# Patient Record
Sex: Male | Born: 1995 | Race: Black or African American | Hispanic: No | Marital: Single | Smoking: Never smoker
Health system: Southern US, Community
[De-identification: ages and names within clinical notes are randomized; demographics above are authoritative.]

## PROBLEM LIST (undated history)

## (undated) DIAGNOSIS — I10 Essential (primary) hypertension: Secondary | ICD-10-CM

## (undated) DIAGNOSIS — F419 Anxiety disorder, unspecified: Secondary | ICD-10-CM

## (undated) DIAGNOSIS — R569 Unspecified convulsions: Secondary | ICD-10-CM

## (undated) HISTORY — PX: ARTHROSCOPIC REPAIR ACL: SUR80

---

## 2021-10-16 ENCOUNTER — Emergency Department: Payer: BC Managed Care – PPO

## 2021-10-16 ENCOUNTER — Emergency Department
Admission: EM | Admit: 2021-10-16 | Discharge: 2021-10-16 | Disposition: A | Discharge status: Home / Self Care | Payer: BC Managed Care – PPO | Attending: Emergency Medicine | Admitting: Emergency Medicine

## 2021-10-16 ENCOUNTER — Other Ambulatory Visit: Payer: Self-pay

## 2021-10-16 DIAGNOSIS — R079 Chest pain, unspecified: Secondary | ICD-10-CM

## 2021-10-16 DIAGNOSIS — Z20822 Contact with and (suspected) exposure to covid-19: Secondary | ICD-10-CM | POA: Diagnosis not present

## 2021-10-16 DIAGNOSIS — I309 Acute pericarditis, unspecified: Secondary | ICD-10-CM | POA: Diagnosis not present

## 2021-10-16 LAB — BASIC METABOLIC PANEL
Anion gap: 8 (ref 5–15)
BUN: 15 mg/dL (ref 6–20)
CO2: 26 mmol/L (ref 22–32)
Calcium: 9.7 mg/dL (ref 8.9–10.3)
Chloride: 102 mmol/L (ref 98–111)
Creatinine, Ser: 0.99 mg/dL (ref 0.61–1.24)
GFR, Estimated: >60 mL/min (ref >60)
Glucose, Bld: 93 mg/dL (ref 70–99)
Potassium: 3.7 mmol/L (ref 3.5–5.1)
Sodium: 136 mmol/L (ref 135–145)

## 2021-10-16 LAB — CBC
HCT: 43.8 % (ref 39.0–52.0)
Hemoglobin: 14.8 g/dL (ref 13.0–17.0)
MCH: 29.2 pg (ref 26.0–34.0)
MCHC: 33.8 g/dL (ref 30.0–36.0)
MCV: 86.4 fL (ref 80.0–100.0)
Platelets: 272 10*3/uL (ref 150–400)
RBC: 5.07 MIL/uL (ref 4.22–5.81)
RDW: 13.1 % (ref 11.5–15.5)
WBC: 7 10*3/uL (ref 4.0–10.5)
nRBC: 0 % (ref 0.0–0.2)

## 2021-10-16 LAB — RESP PANEL BY RT-PCR (FLU A&B, COVID) ARPGX2
Influenza A by PCR: NEGATIVE
Influenza B by PCR: NEGATIVE
SARS Coronavirus 2 by RT PCR: NEGATIVE

## 2021-10-16 LAB — TROPONIN I (HIGH SENSITIVITY)
Troponin I (High Sensitivity): 9 ng/L (ref <18)
Troponin I (High Sensitivity): 11 ng/L (ref <18)

## 2021-10-16 MED ORDER — ONDANSETRON HCL 4 MG/2ML IJ SOLN
4.0000 mg | Freq: Once | INTRAMUSCULAR | Status: AC
  Administered 2021-10-16: 4 mg via INTRAVENOUS
  Filled 2021-10-16: qty 2

## 2021-10-16 MED ORDER — MORPHINE SULFATE (PF) 4 MG/ML IV SOLN
4.0000 mg | Freq: Once | INTRAVENOUS | Status: AC
  Administered 2021-10-16: 4 mg via INTRAVENOUS
  Filled 2021-10-16: qty 1

## 2021-10-16 MED ORDER — MORPHINE SULFATE (PF) 4 MG/ML IV SOLN
INTRAVENOUS | Status: AC
  Filled 2021-10-16: qty 1

## 2021-10-16 NOTE — ED Notes (Signed)
EKG performed and MD shown. Per MD pt needs next bed but not calling code STEMI. Will alert cardiologist for consult per MD Funke/Jessup. Pt brought back to ED 7, RN notified and pt placed on monitor.

## 2021-10-16 NOTE — Discharge Instructions (Addendum)
If tomorrow he has pain then start...  Ibuprofen 600mg  every 8 hours for 7 to 10 days, followed by tapering during a period 400mg  every 8 hours for 7 days then 200mg  every 8 hours for 7 days and then 200mg  every 12 hours for 7 days.  Please call cardiology to get a follow-up this week.  Say u need an ER follow up.  If you are still having some chest discomfort they may elect to start him on additional medicine for pericarditis or they may want to do an echocardiogram.  Return to the ER if develop worsening symptoms, fevers, shortness of breath or any other concerns  Please have patient restart his blood pressure medicine

## 2021-10-16 NOTE — ED Provider Notes (Signed)
[facility] Emergency Department Provider Note  ____________________________________________   Event Date/Time   First MD Initiated Contact with Patient 10/16/21 1656     (approximate)  I have reviewed the triage vital signs and the nursing notes.   HISTORY  Chief Complaint Chest Pain    HPI Warren Saunders is a 25 y.o. male otherwise healthy who comes in with concerns for chest pain.  Patient reports being at work and having sudden onset of chest pain at 2 PM.  Does not report that he was exerting himself especially.  He states that the pain was constant, nonradiating, pressure sensation, nothing made it better or worse.  He states that the pain got better with the nitro and is currently a 1 out of 10.  Also took aspirin.  Denies having exertional chest pain previously.  Denies any risk factors for ACS.  No smoking history, no family history.  Denies any risk factors for PE and denies any shortness of breath.          History reviewed. No pertinent past medical history.  There are no problems to display for this patient.   History reviewed. No pertinent surgical history.  Prior to Admission medications   Not on File    Allergies Patient has no allergy information on record.  No family history on file.  Social History Occasional alcohol Denies cocaine/drugs Denies smoking     Review of Systems Constitutional: No fever/chills Eyes: No visual changes. ENT: No sore throat. Cardiovascular: Positive chest pain Respiratory: Denies shortness of breath. Gastrointestinal: No abdominal pain.  No nausea, no vomiting.  No diarrhea.  No constipation. Genitourinary: Negative for dysuria. Musculoskeletal: Negative for back pain. Skin: Negative for rash. Neurological: Negative for headaches, focal weakness or numbness. All other ROS negative ____________________________________________   PHYSICAL EXAM:  VITAL SIGNS: ED Triage Vitals  Enc  Vitals Group     BP 10/16/21 1704 (!) 157/83     Pulse Rate 10/16/21 1704 71     Resp 10/16/21 1704 16     Temp 10/16/21 1704 98.8 F (37.1 C)     Temp Source 10/16/21 1704 Oral     SpO2 10/16/21 1704 97 %     Weight 10/16/21 1705 290 lb (131.5 kg)     Height 10/16/21 1705 6\' 3"  (1.905 m)     Head Circumference --      Peak Flow --      Pain Score 10/16/21 1643 0     Pain Loc --      Pain Edu? --      Excl. in [place]? --     Constitutional: Alert and oriented. Well appearing and in no acute distress. Eyes: Conjunctivae are normal. EOMI. Head: Atraumatic. Nose: No congestion/rhinnorhea. Mouth/Throat: Mucous membranes are moist.   Neck: No stridor. Trachea Midline. FROM Cardiovascular: Normal rate, regular rhythm. Grossly normal heart sounds.  Good peripheral circulation. Respiratory: Normal respiratory effort.  No retractions. Lungs CTAB. Gastrointestinal: Soft and nontender. No distention. No abdominal bruits.  Musculoskeletal: No lower extremity tenderness nor edema.  No joint effusions. Neurologic:  Normal speech and language. No gross focal neurologic deficits are appreciated.  Skin:  Skin is warm, dry and intact. No rash noted. Psychiatric: Mood and affect are normal. Speech and behavior are normal. GU: Deferred   ____________________________________________   LABS (all labs ordered are listed, but only abnormal results are displayed)  Labs Reviewed  RESP PANEL BY RT-PCR (FLU A&B, COVID) ARPGX2  BASIC  METABOLIC PANEL  CBC  TROPONIN I (HIGH SENSITIVITY)  TROPONIN I (HIGH SENSITIVITY)   ____________________________________________   ED ECG REPORT I, Concha Se, the attending physician, personally viewed and interpreted this ECG.  Initial EKG is sinus rate of 73 with a little bit of ST elevation in 1 and aVL with T wave inversion in lead III, normal intervals.  Repeat EKG shows continued mild ST elevation in 1 and aVL with T wave version lead III with again normal  intervals. ____________________________________________  RADIOLOGY Vela Prose, personally viewed and evaluated these images (plain radiographs) as part of my medical decision making, as well as reviewing the written report by the radiologist.  ED MD interpretation:  no pna  Official radiology report(s): DG Chest Portable 1 View  Result Date: 10/16/2021 CLINICAL DATA:  Chest pain. EXAM: PORTABLE CHEST 1 VIEW COMPARISON:  None. FINDINGS: Cardiomegaly with central vascular prominence. No overt pulmonary edema or focal consolidation. No visible pleural effusion or pneumothorax. The visualized skeletal structures are unremarkable. IMPRESSION: Cardiomegaly with central vascular prominence. No overt pulmonary edema or focal consolidation. Electronically Signed   By: Maudry Mayhew M.D.   On: 10/16/2021 17:27    ____________________________________________   PROCEDURES  Procedure(s) performed (including Critical Care):  Procedures   ____________________________________________   INITIAL IMPRESSION / ASSESSMENT AND PLAN / ED COURSE   Warren Saunders was evaluated in Emergency Department on 10/16/2021 for the symptoms described in the history of present illness. He was evaluated in the context of the global COVID-19 pandemic, which necessitated consideration that the patient might be at risk for infection with the SARS-CoV-2 virus that causes COVID-19. Institutional protocols and algorithms that pertain to the evaluation of patients at risk for COVID-19 are in a state of rapid change based on information released by regulatory bodies including the CDC and federal and state organizations. These policies and algorithms were followed during the patient's care in the ED.    Most Likely DDx:  -Patient EKG concerning for STEMI.  I immediately consulted Dr. Juliann Pares from the STEMI team.  Patient has no risk factors for cardiac disease and he is hypertensive so this could be hypertension versus  pericarditis.  We will hold off on cath lab and due to no risk factors for ACS and EKG is not immediately concerning for ACS.  Patient was given some morphine to help with the rest of his pain   DDx that was also considered d/t potential to cause harm, but was found less likely based on history and physical (as detailed above): -PNA (no fevers, cough but CXR to evaluate) -PNX (reassured with equal b/l breath sounds, CXR to evaluate) -Symptomatic anemia (will get H&H) -Pulmonary embolism as no sob at rest, not pleuritic in nature, no hypoxia.  PERC negative -Aortic Dissection as no tearing pain and no radiation to the mid back, pulses equal -Pericarditis no rub on exam, EKG changes or hx to suggest dx -Tamponade (no notable SOB, tachycardic, hypotensive) -Esophageal rupture (no h/o diffuse vomitting/no crepitus)   Cardiac markers are negative x2 and pain started at 2 PM and this has been a over 4 hours after onset of pain.  His labs are otherwise reassuring.  I had another discussion with patient and his mom who is now at bedside who states that he was feeling sick prior to this starting for about a week.  He had some cough, congestion.  It is possible it could this could be some pericarditis.  Patient reports no  pain at this time.  He denied the pain was different with sitting up.  We will start him a course of ibuprofen.  Mom reports he has not been taking his blood pressure medications and I explained the importance of that as well.  Will have him follow-up with cardiology Dr. Concepcion Living outpatient to discuss whether or not they want to do echocardiogram or add on colchicine or other further management. D/w with Dr. Mylo Red and going to hold on cholchine now gievn symptoms have resolved.      ____________________________________________   FINAL CLINICAL IMPRESSION(S) / ED DIAGNOSES   Final diagnoses:  Chest pain, unspecified type  Acute pericarditis, unspecified type      MEDICATIONS GIVEN DURING THIS VISIT:  Medications - No data to display   ED Discharge Orders     None        Note:  This document was prepared using Dragon voice recognition software and may include unintentional dictation errors.    Vanessa [place], MD 10/16/21 2006

## 2021-10-16 NOTE — ED Triage Notes (Addendum)
Pt comes via EMS from [facility] [employer] with c/p CP that started 200. Pt took some tums with no relief. Pt states the pain came back. Pt later took 324 aspirin after.  EMS gave 324 aspirin and  .4 nitro spray sublingual. Pt states 4/10 nonrad. Pain now resolved.

## 2021-12-19 ENCOUNTER — Other Ambulatory Visit: Payer: Self-pay

## 2021-12-19 ENCOUNTER — Emergency Department (HOSPITAL_COMMUNITY): Payer: BC Managed Care – PPO

## 2021-12-19 ENCOUNTER — Encounter (HOSPITAL_COMMUNITY): Payer: Self-pay

## 2021-12-19 ENCOUNTER — Emergency Department (HOSPITAL_COMMUNITY)
Admission: EM | Admit: 2021-12-19 | Discharge: 2021-12-19 | Disposition: A | Discharge status: Home / Self Care | Payer: BC Managed Care – PPO | Attending: Emergency Medicine | Admitting: Emergency Medicine

## 2021-12-19 DIAGNOSIS — I10 Essential (primary) hypertension: Secondary | ICD-10-CM | POA: Diagnosis not present

## 2021-12-19 DIAGNOSIS — F419 Anxiety disorder, unspecified: Secondary | ICD-10-CM | POA: Insufficient documentation

## 2021-12-19 DIAGNOSIS — R0789 Other chest pain: Secondary | ICD-10-CM | POA: Diagnosis present

## 2021-12-19 HISTORY — DX: Anxiety disorder, unspecified: F41.9

## 2021-12-19 HISTORY — DX: Essential (primary) hypertension: I10

## 2021-12-19 LAB — TROPONIN I (HIGH SENSITIVITY)
Troponin I (High Sensitivity): 10 ng/L (ref <18)
Troponin I (High Sensitivity): 9 ng/L (ref <18)

## 2021-12-19 LAB — BASIC METABOLIC PANEL
Anion gap: 8 (ref 5–15)
BUN: 21 mg/dL — ABNORMAL HIGH (ref 6–20)
CO2: 28 mmol/L (ref 22–32)
Calcium: 10 mg/dL (ref 8.9–10.3)
Chloride: 101 mmol/L (ref 98–111)
Creatinine, Ser: 1.08 mg/dL (ref 0.61–1.24)
GFR, Estimated: >60 mL/min (ref >60)
Glucose, Bld: 81 mg/dL (ref 70–99)
Potassium: 3.5 mmol/L (ref 3.5–5.1)
Sodium: 137 mmol/L (ref 135–145)

## 2021-12-19 LAB — CBC WITH DIFFERENTIAL/PLATELET
Abs Immature Granulocytes: 0.02 10*3/uL (ref 0.00–0.07)
Basophils Absolute: 0 10*3/uL (ref 0.0–0.1)
Basophils Relative: 0 %
Eosinophils Absolute: 0.1 10*3/uL (ref 0.0–0.5)
Eosinophils Relative: 1 %
HCT: 49.5 % (ref 39.0–52.0)
Hemoglobin: 16.5 g/dL (ref 13.0–17.0)
Immature Granulocytes: 0 %
Lymphocytes Relative: 9 %
Lymphs Abs: 0.7 10*3/uL (ref 0.7–4.0)
MCH: 28.8 pg (ref 26.0–34.0)
MCHC: 33.3 g/dL (ref 30.0–36.0)
MCV: 86.5 fL (ref 80.0–100.0)
Monocytes Absolute: 0.7 10*3/uL (ref 0.1–1.0)
Monocytes Relative: 10 %
Neutro Abs: 5.5 10*3/uL (ref 1.7–7.7)
Neutrophils Relative %: 80 %
Platelets: 270 10*3/uL (ref 150–400)
RBC: 5.72 MIL/uL (ref 4.22–5.81)
RDW: 13.2 % (ref 11.5–15.5)
WBC: 6.9 10*3/uL (ref 4.0–10.5)
nRBC: 0 % (ref 0.0–0.2)

## 2021-12-19 MED ORDER — ACETAMINOPHEN 325 MG PO TABS
650.0000 mg | ORAL_TABLET | Freq: Once | ORAL | Status: AC
  Administered 2021-12-19: 650 mg via ORAL
  Filled 2021-12-19: qty 2

## 2021-12-19 MED ORDER — AMLODIPINE BESYLATE 5 MG PO TABS
5.0000 mg | ORAL_TABLET | Freq: Once | ORAL | Status: AC
Start: 2021-12-19 — End: 2021-12-19
  Administered 2021-12-19: 5 mg via ORAL
  Filled 2021-12-19: qty 1

## 2021-12-19 MED ORDER — LORAZEPAM 0.5 MG PO TABS
0.5000 mg | ORAL_TABLET | Freq: Once | ORAL | Status: AC
Start: 2021-12-19 — End: 2021-12-19
  Administered 2021-12-19: 0.5 mg via ORAL
  Filled 2021-12-19: qty 1

## 2021-12-19 MED ORDER — HYDROXYZINE HCL 25 MG PO TABS
25.0000 mg | ORAL_TABLET | Freq: Once | ORAL | Status: AC
  Administered 2021-12-19: 25 mg via ORAL
  Filled 2021-12-19: qty 1

## 2021-12-19 MED ORDER — HYDROXYZINE HCL 25 MG PO TABS
25.0000 mg | ORAL_TABLET | Freq: Four times a day (QID) | ORAL | 0 refills | Status: AC | PRN
Start: 2021-12-19 — End: ?

## 2021-12-19 NOTE — ED Triage Notes (Addendum)
Patient BIB EMS from home with c/o anxiety. Pt has prior hx of anxiety but is not taking any medications. Pt reports multiple stressors with work that brought on his anxiety tonight. Endorses tightness in chest.

## 2021-12-19 NOTE — ED Provider Notes (Signed)
Warren Saunders-EMERGENCY DEPT Provider Note   CSN: [id number] Arrival date & time: 12/19/21  [id number]     History  Chief Complaint  Patient presents with   Chest Pain    Ed BlalockCameron Saunders is a 26 y.o. male.  Warren LangCameron Saunders is a 26 y.o. male with history of hypertension, anxiety and depression, who presents to the ED for evaluation of chest pain.  Patient arrives via EMS, initially with complaints of anxiety but then reported chest pain and chest tightness.  Patient is accompanied by his girlfriend who reports he woke from sleep with heavy breathing and reported central chest pain and chest tightness.  Symptoms seem to have eased off but not completely resolved.  He reports a prior history of anxiety but has not been on any medications for this.  Does report multiple recent stressors with work, was waking up this morning to go to work when the symptoms began.  He reports central chest tightness that is not worse with exertion or pleuritic in nature.  Some associated shortness of breath when chest tightness is severe.  No cough or fever.  No abdominal pain, nausea or vomiting.  He does report a generalized headache since symptoms began.  On chart review it appears he has been seen for similar episodes of chest pain previously as well as for panic attack.  No known cardiac history.  The history is provided by the patient and a significant other.      Home Medications Prior to Admission medications   Medication Sig Start Date End Date Taking? Authorizing Provider  hydrOXYzine (ATARAX) 25 MG tablet Take 1 tablet (25 mg total) by mouth every 6 (six) hours as needed for anxiety. 12/19/21  Yes Dartha LodgeFord, Tametha Banning N, PA-C      Allergies    Patient has no known allergies.    Review of Systems   Review of Systems  Constitutional:  Negative for chills and fever.  HENT: Negative.    Respiratory:  Positive for chest tightness and shortness of breath. Negative for cough.   Cardiovascular:   Positive for chest pain. Negative for palpitations and leg swelling.  Gastrointestinal:  Negative for abdominal pain, nausea and vomiting.  Musculoskeletal:  Negative for arthralgias and myalgias.  Skin:  Negative for color change and wound.  Neurological:  Positive for headaches. Negative for dizziness, syncope and light-headedness.  Psychiatric/Behavioral:  The patient is nervous/anxious.   All other systems reviewed and are negative.  Physical Exam Updated Vital Signs BP (!) 164/99    Pulse  85    Temp 98.5 F (36.9 C) (Oral)    Resp 18    Ht 6\' 3"  (1.905 m)    Wt 129.3 kg    SpO2 100%    BMI 35.62 kg/m  Physical Exam Vitals and nursing note reviewed.  Constitutional:      General: He is not in acute distress.    Appearance: Normal appearance. He is well-developed. He is not diaphoretic.     Comments: Alert, appears anxious, but well-appearing and in no acute distress  HENT:     Head: Normocephalic and atraumatic.  Eyes:     General:        Right eye: No discharge.        Left eye: No discharge.     Extraocular Movements: Extraocular movements intact.     Pupils: Pupils are equal, round, and reactive to light.  Cardiovascular:     Rate and Rhythm: Normal rate and regular  rhythm.     Pulses: Normal pulses.          Radial pulses are 2+ on the right side and 2+ on the left side.       Dorsalis pedis pulses are 2+ on the right side and 2+ on the left side.     Heart sounds: Normal heart sounds. No murmur heard.   No friction rub. No gallop.  Pulmonary:     Effort: Pulmonary effort is normal. No respiratory distress.     Breath sounds: Normal breath sounds. No wheezing or rales.     Comments: Respirations equal and unlabored, patient able to speak in full sentences, lungs clear to auscultation bilaterally  Chest:     Chest wall: No tenderness.  Abdominal:     General: Bowel sounds are normal. There is no distension.     Palpations: Abdomen is soft. There is no mass.      Tenderness: There is no abdominal tenderness. There is no guarding.     Comments: Abdomen soft, nondistended, nontender to palpation in all quadrants without guarding or peritoneal signs  Musculoskeletal:        General: No deformity.     Cervical back: Neck supple.  Skin:    General: Skin is warm and dry.     Capillary Refill: Capillary refill takes less than 2 seconds.  Neurological:     Mental Status: He is alert and oriented to [place], place, and time.     Coordination: Coordination normal.     Comments: Speech is clear, able to follow commands Moves extremities without ataxia, coordination intact  Psychiatric:        Mood and Affect: Mood normal.        Behavior: Behavior normal.    ED Results / Procedures / Treatments   Labs (all labs ordered are listed, but only abnormal results are displayed) Labs Reviewed  BASIC METABOLIC PANEL - Abnormal; Notable for the following components:      Result Value   BUN 21 (*)    All other components within normal limits  CBC WITH DIFFERENTIAL/PLATELET  TROPONIN I (HIGH SENSITIVITY)  TROPONIN I (HIGH SENSITIVITY)    EKG EKG Interpretation  Date/Time:  Thursday December 19 2021 08:50:30 EST Ventricular Rate:  78 PR Interval:  155 QRS Duration: 101 QT Interval:  329 QTC Calculation: 375 R Axis:   86 Text Interpretation: Sinus rhythm Confirmed by Kristine Royal [phone]) on 12/19/2021 8:57:55 AM  Radiology DG Chest 2 View  Result Date: 12/19/2021 CLINICAL DATA:  CP EXAM: CHEST - 2 VIEW COMPARISON:  10/16/2021. FINDINGS: Enlarged cardiac silhouette, similar to prior. No consolidation. No visible pleural effusions or pneumothorax. Mild S-shaped thoracolumbar curvature. IMPRESSION: 1. No evidence of acute cardiopulmonary disease. 2. Similar cardiomegaly. Electronically Signed   By: Feliberto Harts M.D.   On: 12/19/2021 08:34    Procedures Procedures    Medications Ordered in ED Medications  hydrOXYzine (ATARAX) tablet 25 mg (25 mg  Oral Given 12/19/21 0903)  LORazepam (ATIVAN) tablet 0.5 mg (0.5 mg Oral Given 12/19/21 0903)  amLODipine (NORVASC) tablet 5 mg (5 mg Oral Given 12/19/21 1105)  acetaminophen (TYLENOL) tablet 650 mg (650 mg Oral Given 12/19/21 1113)    ED Course/ Medical Decision Making/ A&P                           Medical Decision Making Problems Addressed: Anxiety: acute illness or injury Chest tightness: undiagnosed new  problem with uncertain prognosis Hypertension, unspecified type: chronic illness or injury  Amount and/or Complexity of Data Reviewed Independent Historian: parent and friend External Data Reviewed: labs, radiology, ECG and notes. Labs: ordered. Radiology: ordered and independent interpretation performed. ECG/medicine tests: independent interpretation performed.  Risk OTC drugs. Prescription drug management.   This patient presents to the ED for concern of chest pain and shortness of breath, this involves an extensive number of treatment options, and is a complaint that carries with it a high risk of complications and morbidity.  The differential diagnosis includes ACS, PE, pneumothorax, aortic dissection, esophageal perforation, pneumonia, GERD, musculoskeletal pain, anxiety, abdominal process   Co morbidities that complicate the patient evaluation  Hypertension, anxiety    Additional history obtained:  Additional history obtained from girlfriend and parents at bedside External records from outside source obtained and reviewed including prior ED evaluations for chest pain and anxiety   Lab Tests:  I Ordered, and personally interpreted labs.  The pertinent results include: No leukocytosis and normal hemoglobin, no electrolyte derangements, normal renal function, troponins negative x2   Imaging Studies ordered:  I ordered imaging studies including chest x-ray I independently visualized and interpreted imaging which showed no active cardiopulmonary disease I agree with  the radiologist interpretation   EKG and cardiac Monitoring:  EKG with normal sinus rhythm with no concerning ischemic changes The patient was maintained on a cardiac monitor.  I personally viewed and interpreted the cardiac monitored which showed an underlying rhythm of: Sinus rhythm   Medicines ordered and prescription drug management:  I ordered medication including hydroxyzine and Ativan for anxiety, patient also given a dose of his home blood pressure medication that he has not taken today for hypertension Reevaluation of the patient after these medicines showed that the patient improved I have reviewed the patients home medicines and have made adjustments as needed   Test Considered:  Considered evaluation for PE with D-dimer or CT angio, but patient is low risk [place] and PERC negative so low suspicion for this in the setting of patient's presentation today   Problem List / ED Course:  Patient arrives with chest tightness and shortness of breath that started when he woke up to go to work this morning.  Has been under a lot of stress recently with work, underlying history of anxiety and appears patient has had symptoms suggestive of panic attack previously.  He does not have significant cardiac risk factors aside from hypertension and obesity, suspect this is very unlikely given his age.  Symptoms are not suggestive of aortic dissection. Patient had episode after returning from chest x-ray where he reported that his chest tightness was worse and he was breathing heavily, I was called to bedside and I was able to talk patient through breathing exercises that resolved the symptoms.  High clinical suspicion for anxiety or panic attack Work-up today has been reassuring and does not suggest ACS or other acute cardiopulmonary process.  Symptoms have improved with treatment here in the ED.  Patient has been quite hypertensive but did not take his home blood pressure medication, he has been  given a dose of this today and encouraged to follow-up closely with his PCP for continued blood pressure management as well as for further management of his anxiety   Reevaluation:  After the interventions noted above, I reevaluated the patient and found that they have :improved   Social Determinants of Health:  Significantly increased stress with work   Dispostion:  After consideration  of the diagnostic results and the patients response to treatment, I feel that the patent would benefit from discharge home with close outpatient follow-up with primary care provider, cardiology referral provided as well.  Patient prescribed hydroxyzine to help manage acute episodes of anxiety, but stressed the importance of following up with his PCP for better chronic management of anxiety symptoms as well as better blood pressure control.  Patient requested to be written out of work a few days to help him manage his stress, work note provided.  At this time feel he is appropriate for discharge home, return precautions provided.         Final Clinical Impression(s) / ED Diagnoses Final diagnoses:  Chest tightness  Hypertension, unspecified type  Anxiety    Rx / DC Orders ED Discharge Orders          Ordered    hydrOXYzine (ATARAX) 25 MG tablet  Every 6 hours PRN        12/19/21 1243              Dartha Lodge, [place] 12/24/21 1134    Wynetta Fines, MD 12/26/21 [phone]

## 2021-12-19 NOTE — Discharge Instructions (Addendum)
You were seen in the emergency department today for chest pain. Your work-up in the emergency department has been overall reassuring. Your labs have been fairly normal and or similar to previous blood work you have had done. Your EKG and the enzyme we use to check your heart did not show an acute heart attack at this time. Your chest x-ray was normal.   Please make sure you are taking your blood pressure medication regularly, monitor your blood pressure by checking it twice a day and keeping a log of this to help your doctor make decisions about whether or not you need additional medications or dosage change.  I suspect anxiety is contributing to your symptoms as well and you may have had a panic attack today.  If you are feeling increasingly anxious, you can take prescribed hydroxyzine.  Please discuss continued management of anxiety with your primary care provider.  We would like you to follow up closely with your primary care provider and/or the cardiologist provided in your discharge instructions within 1 week. Return to the ER immediately should you experience any new or worsening symptoms including but not limited to return of pain, worsened pain, vomiting, shortness of breath, dizziness, lightheadedness, passing out, or any other concerns that you may have.

## 2021-12-23 ENCOUNTER — Emergency Department
Admission: EM | Admit: 2021-12-23 | Discharge: 2021-12-23 | Disposition: A | Discharge status: Home / Self Care | Payer: BC Managed Care – PPO | Attending: Emergency Medicine | Admitting: Emergency Medicine

## 2021-12-23 ENCOUNTER — Other Ambulatory Visit: Payer: Self-pay

## 2021-12-23 ENCOUNTER — Emergency Department: Payer: BC Managed Care – PPO

## 2021-12-23 DIAGNOSIS — R7402 Elevation of levels of lactic acid dehydrogenase (LDH): Secondary | ICD-10-CM | POA: Insufficient documentation

## 2021-12-23 DIAGNOSIS — F419 Anxiety disorder, unspecified: Secondary | ICD-10-CM | POA: Diagnosis not present

## 2021-12-23 DIAGNOSIS — Y990 Civilian activity done for income or pay: Secondary | ICD-10-CM | POA: Diagnosis not present

## 2021-12-23 DIAGNOSIS — R569 Unspecified convulsions: Secondary | ICD-10-CM | POA: Insufficient documentation

## 2021-12-23 DIAGNOSIS — R079 Chest pain, unspecified: Secondary | ICD-10-CM | POA: Insufficient documentation

## 2021-12-23 DIAGNOSIS — Y900 Blood alcohol level of less than 20 mg/100 ml: Secondary | ICD-10-CM | POA: Diagnosis not present

## 2021-12-23 HISTORY — DX: Unspecified convulsions: R56.9

## 2021-12-23 LAB — COMPREHENSIVE METABOLIC PANEL WITH GFR
ALT: 23 U/L (ref 0–44)
AST: 35 U/L (ref 15–41)
Albumin: 3.9 g/dL (ref 3.5–5.0)
Alkaline Phosphatase: 49 U/L (ref 38–126)
Anion gap: 9 (ref 5–15)
BUN: 16 mg/dL (ref 6–20)
CO2: 26 mmol/L (ref 22–32)
Calcium: 9 mg/dL (ref 8.9–10.3)
Chloride: 103 mmol/L (ref 98–111)
Creatinine, Ser: 0.92 mg/dL (ref 0.61–1.24)
GFR, Estimated: >60 mL/min
Glucose, Bld: 84 mg/dL (ref 70–99)
Potassium: 3.6 mmol/L (ref 3.5–5.1)
Sodium: 138 mmol/L (ref 135–145)
Total Bilirubin: 0.5 mg/dL (ref 0.3–1.2)
Total Protein: 7.9 g/dL (ref 6.5–8.1)

## 2021-12-23 LAB — ETHANOL: Alcohol, Ethyl (B): <10 mg/dL

## 2021-12-23 LAB — CBC WITH DIFFERENTIAL/PLATELET
Abs Immature Granulocytes: 0.01 10*3/uL (ref 0.00–0.07)
Basophils Absolute: 0 10*3/uL (ref 0.0–0.1)
Basophils Relative: 1 %
Eosinophils Absolute: 0.2 10*3/uL (ref 0.0–0.5)
Eosinophils Relative: 5 %
HCT: 43.6 % (ref 39.0–52.0)
Hemoglobin: 14.2 g/dL (ref 13.0–17.0)
Immature Granulocytes: 0 %
Lymphocytes Relative: 50 %
Lymphs Abs: 2 10*3/uL (ref 0.7–4.0)
MCH: 28.6 pg (ref 26.0–34.0)
MCHC: 32.6 g/dL (ref 30.0–36.0)
MCV: 87.9 fL (ref 80.0–100.0)
Monocytes Absolute: 0.3 10*3/uL (ref 0.1–1.0)
Monocytes Relative: 7 %
Neutro Abs: 1.5 10*3/uL — ABNORMAL LOW (ref 1.7–7.7)
Neutrophils Relative %: 37 %
Platelets: 247 10*3/uL (ref 150–400)
RBC: 4.96 MIL/uL (ref 4.22–5.81)
RDW: 13 % (ref 11.5–15.5)
Smear Review: NORMAL
WBC: 4 10*3/uL (ref 4.0–10.5)
nRBC: 0 % (ref 0.0–0.2)

## 2021-12-23 LAB — URINE DRUG SCREEN, QUALITATIVE (ARMC ONLY)
Amphetamines, Ur Screen: NOT DETECTED
Barbiturates, Ur Screen: NOT DETECTED
Benzodiazepine, Ur Scrn: POSITIVE — AB
Cannabinoid 50 Ng, Ur ~~LOC~~: POSITIVE — AB
Cocaine Metabolite,Ur ~~LOC~~: NOT DETECTED
MDMA (Ecstasy)Ur Screen: NOT DETECTED
Methadone Scn, Ur: NOT DETECTED
Opiate, Ur Screen: NOT DETECTED
Phencyclidine (PCP) Ur S: NOT DETECTED
Tricyclic, Ur Screen: NOT DETECTED

## 2021-12-23 LAB — LACTIC ACID, PLASMA: Lactic Acid, Venous: 2.4 mmol/L (ref 0.5–1.9)

## 2021-12-23 LAB — URINALYSIS, ROUTINE W REFLEX MICROSCOPIC
Bilirubin Urine: NEGATIVE
Glucose, UA: NEGATIVE mg/dL
Hgb urine dipstick: NEGATIVE
Ketones, ur: NEGATIVE mg/dL
Leukocytes,Ua: NEGATIVE
Nitrite: NEGATIVE
Protein, ur: NEGATIVE mg/dL
Specific Gravity, Urine: 1.014 (ref 1.005–1.030)
pH: 7 (ref 5.0–8.0)

## 2021-12-23 MED ORDER — SODIUM CHLORIDE 0.9 % IV SOLN: INTRAVENOUS | Status: DC

## 2021-12-23 MED ORDER — SODIUM CHLORIDE 0.9 % IV BOLUS
1000.0000 mL | Freq: Once | INTRAVENOUS | Status: AC
  Administered 2021-12-23: 1000 mL via INTRAVENOUS

## 2021-12-23 MED ORDER — LORAZEPAM 2 MG/ML IJ SOLN
1.0000 mg | Freq: Once | INTRAMUSCULAR | Status: AC
  Administered 2021-12-23: 1 mg via INTRAVENOUS
  Filled 2021-12-23: qty 1

## 2021-12-23 MED ORDER — IOHEXOL 350 MG/ML SOLN
75.0000 mL | Freq: Once | INTRAVENOUS | Status: AC | PRN
  Administered 2021-12-23: 75 mL via INTRAVENOUS

## 2021-12-23 MED ORDER — SODIUM CHLORIDE 0.9 % IV SOLN
3000.0000 mg | Freq: Once | INTRAVENOUS | Status: AC
  Administered 2021-12-23: 3000 mg via INTRAVENOUS
  Filled 2021-12-23: qty 30

## 2021-12-23 NOTE — ED Triage Notes (Signed)
Pt here via [facility] with a seizure at work. 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 4mg  of versed given per ems. 18G RAC. Pt has cardiac hx, postitcal on arrival to ED.   125/87 80 40-co2 30 RR

## 2021-12-23 NOTE — ED Provider Notes (Signed)
[facility] Provider Note    Event Date/Time   First MD Initiated Contact with Patient 12/23/21 1152     (approximate)   History   Seizures   HPI [facility] Saunders is a 26 y.o. male with no stated past medical history presents via EMS after having 4 episodes of alleged seizure activity.  Patient was given 2 of Versed prior to arrival with 1 episode witnessed by EMS of generalized tonic-clonic activity with a prolonged postictal period.  Patient only complains of chest pain upon arrival but states that he does not have any seizure history and is slow to respond to questions.  Patient is unable to fully interact with this interviewer and further history/review of systems deferred until patient's mental status improves     Physical Exam   Triage Vital Signs: ED Triage Vitals  Enc Vitals Group     BP 12/23/21 1152 131/73     Pulse Rate 12/23/21 1153 81     Resp 12/23/21 1152 (!) 28     Temp 12/23/21 1154 98.1 F (36.7 C)     Temp Source 12/23/21 1154 Axillary     SpO2 12/23/21 1153 100 %     Weight 12/23/21 1150 285 lb 0.9 oz (129.3 kg)     Height 12/23/21 1150 6\' 3"  (1.905 m)     Head Circumference --      Peak Flow --      Pain Score 12/23/21 1150 0     Pain Loc --      Pain Edu? --      Excl. in GC? --     Most recent vital signs: Vitals:   12/23/21 1800 12/23/21 1823  BP: (!) 156/99 (!) 158/74  Pulse: 68 72  Resp: (!) 22 17  Temp:  98.4 F (36.9 C)  SpO2: 99% 98%    General: Somnolent but arousable and oriented to [place] and place CV:  Good peripheral perfusion.  Resp:  Normal effort.  Abd:  No distention.  Other:  Young overweight Caucasian male laying in bed somnolent but easily arousable   ED Results / Procedures / Treatments   Labs (all labs ordered are listed, but only abnormal results are displayed) Labs Reviewed  CBC WITH DIFFERENTIAL/PLATELET - Abnormal; Notable for the following components:      Result Value   Neutro Abs  1.5 (*)    All other components within normal limits  URINALYSIS, ROUTINE W REFLEX MICROSCOPIC - Abnormal; Notable for the following components:   Color, Urine YELLOW (*)    APPearance CLEAR (*)    All other components within normal limits  URINE DRUG SCREEN, QUALITATIVE ([facility] ONLY) - Abnormal; Notable for the following components:   Cannabinoid 50 Ng, Ur [place] POSITIVE (*)    Benzodiazepine, Ur Scrn POSITIVE (*)    All other components within normal limits  LACTIC ACID, PLASMA - Abnormal; Notable for the following components:   Lactic Acid, Venous 2.4 (*)    All other components within normal limits  COMPREHENSIVE METABOLIC PANEL  ETHANOL     EKG ED ECG REPORT I, 12/25/21, the attending physician, personally viewed and interpreted this ECG.  Date: 12/23/2021 EKG Time: 1539 Rate: 65 Rhythm: normal sinus rhythm QRS Axis: normal Intervals: normal ST/T Wave abnormalities: normal Narrative Interpretation: no evidence of acute ischemia   RADIOLOGY ED MD interpretation: CT of the head without contrast shows no evidence of acute abnormalities including no intracerebral hemorrhage, obvious masses, or  significant edema -Agree with radiology assessment Official radiology report(s): CT HEAD WO CONTRAST  Result Date: 12/23/2021 CLINICAL DATA:  Seizure, new-onset, no history of trauma EXAM: CT HEAD WITHOUT CONTRAST TECHNIQUE: Contiguous axial images were obtained from the base of the skull through the vertex without intravenous contrast. RADIATION DOSE REDUCTION: This exam was performed according to the departmental dose-optimization program which includes automated exposure control, adjustment of the mA and/or kV according to patient size and/or use of iterative reconstruction technique. COMPARISON:  None. FINDINGS: Brain: No evidence of acute intracranial hemorrhage or extra-axial collection.No evidence of mass lesion/concern mass effect.The ventricles are normal in size. Vascular: No  hyperdense vessel or unexpected calcification. Skull: Normal. Negative for fracture or focal lesion. Sinuses/Orbits: Mucosal thickening of the ethmoid air cells, sphenoid sinuses, and frontal sinuses. Other: None. IMPRESSION: No acute intracranial abnormality. Electronically Signed   By: Caprice Renshaw M.D.   On: 12/23/2021 12:38   CT Angio Chest PE W/Cm &/Or Wo Cm  Result Date: 12/23/2021 CLINICAL DATA:  Seizure activity at work, concern for pulmonary embolus EXAM: CT ANGIOGRAPHY CHEST WITH CONTRAST TECHNIQUE: Multidetector CT imaging of the chest was performed using the standard protocol during bolus administration of intravenous contrast. Multiplanar CT image reconstructions and MIPs were obtained to evaluate the vascular anatomy. RADIATION DOSE REDUCTION: This exam was performed according to the departmental dose-optimization program which includes automated exposure control, adjustment of the mA and/or kV according to patient size and/or use of iterative reconstruction technique. CONTRAST:  40mL OMNIPAQUE IOHEXOL 350 MG/ML SOLN COMPARISON:  12/19/2021 FINDINGS: Cardiovascular: Intact thoracic aorta. Patent 2 vessel arch anatomy. Negative for aneurysm or dissection. No acute aortic process. Central pulmonary arteries are normal in caliber and appear patent. No significant central or proximal hilar large pulmonary embolus. Limited assessment of the lower lobe segmental branches because limited contrast opacification and respiratory motion artifact. Normal heart size.  No pericardial effusion. Mediastinum/Nodes: No enlarged mediastinal, hilar, or axillary lymph nodes. Thyroid gland, trachea, and esophagus demonstrate no significant findings. Lungs/Pleura: Minimal bibasilar atelectasis. Posterior right upper lobe central ground-glass nodule measures 8 mm, image 33/7 remains nonspecific. No other acute airspace process, collapse or consolidation. Negative for significant pneumonia. No interstitial disease or edema.  No pleural abnormality, effusion, or pneumothorax. Trachea and central airways are patent. Upper Abdomen: No acute upper abdominal finding. Musculoskeletal: Symmetric gynecomastia.  No acute osseous finding. Review of the MIP images confirms the above findings. IMPRESSION: Negative for significant acute pulmonary embolus by CTA. No other significant acute intrathoracic finding. Minor basilar atelectasis. 8 mm central right upper lobe ground-glass nodule. Initial follow-up with CT at 6-12 months is recommended to confirm persistence. If persistent, repeat CT is recommended every 2 years until 5 years of stability has been established. This recommendation follows the consensus statement: Guidelines for Management of Incidental Pulmonary Nodules Detected on CT Images: From the Fleischner Society 2017; Radiology 2017; 284:228-243. Electronically Signed   By: Judie Petit.  Shick M.D.   On: 12/23/2021 16:46      PROCEDURES:  Critical Care performed: No  Procedures   MEDICATIONS ORDERED IN ED: Medications  sodium chloride 0.9 % bolus 1,000 mL (0 mLs Intravenous Stopped 12/23/21 1310)  levETIRAcetam (KEPPRA) 3,000 mg in sodium chloride 0.9 % 250 mL IVPB (0 mg Intravenous Stopped 12/23/21 1240)  iohexol (OMNIPAQUE) 350 MG/ML injection 75 mL (75 mLs Intravenous Contrast Given 12/23/21 1623)  LORazepam (ATIVAN) injection 1 mg (1 mg Intravenous Given 12/23/21 1719)     IMPRESSION / MDM / ASSESSMENT  AND PLAN / ED COURSE  I reviewed the triage vital signs and the nursing notes.                              Differential diagnosis includes, but is not limited to, seizure, pseudoseizure, intracranial hemorrhage, electrolyte abnormality  The patient is on the cardiac monitor to evaluate for evidence of arrhythmia and/or significant heart rate changes.  Patient is a 26 year old male who presents for related seizure activity while on the job as well as witnessed by EMS.  EMS gave 2 mg of Versed prior to arrival with no  further seizure activity noted.  After patient arrived, overall negative work-up including head CT not showing any evidence of acute abnormalities.  CBC shows no evidence of leukocytosis or anemia.  Metabolic panel shows no evidence of electrolyte abnormalities or organ dysfunction.  Patient's lactic acidosis of 2.4 very minimally elevated and not nearly as high as would be expected after multiple witnessed seizures and therefore low likelihood of seizure activity.  Upon being told that patient was going to be discharged, he began having another episode of shaking, clutching his chest, and not speaking.  Patient had no intraoral trauma, bowel/bladder incontinence, or prolonged postictal period.  When asked what hurts patient only points to his chest and will not speak.  When patient's mother and sister arrived they did inform this provider that patient was recently seen in [place] [facility] emergency department for anxiety and having a panic attack that looks similar.  Patient will be treated with 1 mg Ativan awaiting reassessment.  Care of this patient will be signed out to the oncoming physician at the end of my shift.  All pertinent patient information conveyed and all questions answered.  All further care and disposition decisions will be made by the oncoming physician.      FINAL CLINICAL IMPRESSION(S) / ED DIAGNOSES   Final diagnoses:  Anxiety     Rx / DC Orders   ED Discharge Orders     None        Note:  This document was prepared using Dragon voice recognition software and may include unintentional dictation errors.   Merwyn Katos, MD 12/24/21 [phone]

## 2021-12-23 NOTE — ED Notes (Addendum)
Pt family called this RN to bedside stating something was wrong. Pt vitals stable O2 100%. Pt rolling around and grabbing stomach then chest.  Bradler MD called to bedside

## 2021-12-23 NOTE — ED Notes (Signed)
Pt now resting comfortably with eyes closed. Family at bedside.

## 2021-12-23 NOTE — Discharge Instructions (Signed)
Your tests today were all okay.  Please follow-up with primary care for further evaluation of your anxiety symptoms and shaking spells.

## 2021-12-23 NOTE — ED Notes (Addendum)
Pt violently turning on bed from one side to the other moaning but not answering questions. Vital signs stable. Vicente Males, MD at bedside.

## 2021-12-23 NOTE — ED Notes (Signed)
RN was alerted to pt room after pt family was screaming. RN walked into pt room to find him have seizure like activity that lasted for around 20 seconds. Pt was thrashing all over the bed and was moving his hips from side to side while rolling on the bed and up the side rail. Pt was also holding his chest during this time. Pt continued and was holding his breath during this time periodically.  During this time pt VS remained WNL. Provider at bedside.

## 2021-12-31 ENCOUNTER — Ambulatory Visit (INDEPENDENT_AMBULATORY_CARE_PROVIDER_SITE_OTHER): Payer: BC Managed Care – PPO | Admitting: Cardiology

## 2021-12-31 ENCOUNTER — Encounter: Payer: Self-pay | Admitting: Cardiology

## 2021-12-31 ENCOUNTER — Other Ambulatory Visit: Payer: Self-pay

## 2021-12-31 ENCOUNTER — Ambulatory Visit (INDEPENDENT_AMBULATORY_CARE_PROVIDER_SITE_OTHER): Payer: BC Managed Care – PPO

## 2021-12-31 VITALS — BP 124/84 | HR 75 | Ht 72.0 in | Wt 289.2 lb

## 2021-12-31 DIAGNOSIS — R55 Syncope and collapse: Secondary | ICD-10-CM

## 2021-12-31 DIAGNOSIS — R072 Precordial pain: Secondary | ICD-10-CM | POA: Diagnosis not present

## 2021-12-31 DIAGNOSIS — I10 Essential (primary) hypertension: Secondary | ICD-10-CM

## 2021-12-31 MED ORDER — IVABRADINE HCL 7.5 MG PO TABS: 15.0000 mg | ORAL_TABLET | Freq: Once | ORAL | 0 refills | Status: AC

## 2021-12-31 MED ORDER — METOPROLOL TARTRATE 100 MG PO TABS
100.0000 mg | ORAL_TABLET | Freq: Once | ORAL | 0 refills | Status: DC
Start: 2021-12-31 — End: 2022-03-18

## 2021-12-31 NOTE — Progress Notes (Signed)
Cardiology Office Note:    Date:  12/31/2021   ID:  Warren Saunders, DOB 1996-07-16, MRN [id number]  PCP:  Oneita HurtPcp, No   [facility] Providers Cardiologist:  Debbe OdeaBrian Agbor-Etang, MD     Referring MD: No ref. provider found   Chief Complaint  Patient presents with   Other    Fu armc chest pain no complaints today. Meds reviewed verbally with pt.    History of Present Illness:    Warren Saunders is a 26 y.o. male with a hx of hypertension, anxiety who presents due to chest pain.  Patient works at a [employer].  While at work a week ago, he passed out, EMS was taken to the [facility] with presumed seizure-like activity.  Was given Ativan and advised to follow-up with cardiology since patient complains of chest tightness prior to passing out.  States having an incident 2 weeks ago while asleep, woke up with severe shortness of breath diagnosed with possible panic attack.  Has not followed up with neurology since being discharged from the [facility].  Sees primary care provider next week.  He denies any history of heart disease, smokes marijuana, denies smoking cigarettes.  Past Medical History:  Diagnosis Date   Anxiety    Hypertension    Seizure [facility](HCC)     Past Surgical History:  Procedure Laterality Date   ARTHROSCOPIC REPAIR ACL      Current Medications: Current Meds  Medication Sig   amLODipine (NORVASC) 5 MG tablet Take 5 mg by mouth daily.   hydrOXYzine (ATARAX) 25 MG tablet Take 1 tablet (25 mg total) by mouth every 6 (six) hours as needed for anxiety.   ivabradine (CORLANOR) 7.5 MG TABS tablet Take 2 tablets (15 mg total) by mouth once for 1 dose. Take 2 hours prior to your CT scan.   metoprolol tartrate (LOPRESSOR) 100 MG tablet Take 1 tablet (100 mg total) by mouth once for 1 dose. Take 2 hours prior to your CT scan.     Allergies:   Patient has no known allergies.   Social History   Socioeconomic History   Marital status: Single    Spouse name: Not on file   Number of  children: Not on file   Years of education: Not on file   Highest education level: Not on file  Occupational History   Not on file  Tobacco Use   Smoking status: Never   Smokeless tobacco: Never  Substance and Sexual Activity   Alcohol use: Yes   Drug use: Yes    Types: Marijuana   Sexual activity: Yes  Other Topics Concern   Not on file  Social History Narrative   Not on file   Social Determinants of Health   Financial Resource Strain: Not on file  Food Insecurity: Not on file  Transportation Needs: Not on file  Physical Activity: Not on file  Stress: Not on file  Social Connections: Not on file     Family History: The patient's family history is not on file.  ROS:   Please see the history of present illness.     All other systems reviewed and are negative.  EKGs/Labs/Other Studies Reviewed:    The following studies were reviewed today:   EKG:  EKG not ordered today.    Recent Labs: 12/23/2021: ALT 23; BUN 16; Creatinine, Ser 0.92; Hemoglobin 14.2; Platelets 247; Potassium 3.6; Sodium 138  Recent Lipid Panel No results found for: CHOL, TRIG, HDL, CHOLHDL, VLDL, LDLCALC, LDLDIRECT  Risk Assessment/Calculations:          Physical Exam:    VS:  BP 124/84 (BP Location: Right Arm, Patient Position: Sitting, Cuff Size: Large)    Pulse 75    Ht 6' (1.829 m)    Wt 289 lb 4 oz (131.2 kg)    SpO2 97%    BMI 39.23 kg/m     Wt Readings from Last 3 Encounters:  12/31/21 289 lb 4 oz (131.2 kg)  12/23/21 285 lb 0.9 oz (129.3 kg)  12/19/21 285 lb (129.3 kg)     GEN:  Well nourished, well developed in no acute distress HEENT: Normal NECK: No JVD; No carotid bruits LYMPHATICS: No lymphadenopathy CARDIAC: RRR, no murmurs, rubs, gallops RESPIRATORY:  Clear to auscultation without rales, wheezing or rhonchi  ABDOMEN: Soft, non-tender, non-distended MUSCULOSKELETAL:  No edema; No deformity  SKIN: Warm and dry NEUROLOGIC:  Alert and oriented x 3 PSYCHIATRIC:   Normal affect   ASSESSMENT:    1. Precordial pain   2. Syncope and collapse   3. Primary hypertension    PLAN:    In order of problems listed above:  Chest pain, risk factors hypertension.  Recent syncope.  Get echocardiogram, get coronary CTA to evaluate presence of CAD. Syncope, seizure-like activity.  Place cardiac monitor to rule out any cardiac etiology such as arrhythmias.  Echocardiogram as above.  Recommend neurology eval after discussion with PCP next week. Hypertension, BP controlled.  Continue Norvasc.  Follow-up in 6 to 8 weeks after cardiac testing.       Medication Adjustments/Labs and Tests Ordered: Current medicines are reviewed at length with the patient today.  Concerns regarding medicines are outlined above.  Orders Placed This Encounter  Procedures   CT CORONARY MORPH W/CTA COR W/SCORE W/CA W/CM &/OR WO/CM   LONG TERM MONITOR (3-14 DAYS)   EKG 12-Lead   ECHOCARDIOGRAM COMPLETE   Meds ordered this encounter  Medications   metoprolol tartrate (LOPRESSOR) 100 MG tablet    Sig: Take 1 tablet (100 mg total) by mouth once for 1 dose. Take 2 hours prior to your CT scan.    Dispense:  1 tablet    Refill:  0   ivabradine (CORLANOR) 7.5 MG TABS tablet    Sig: Take 2 tablets (15 mg total) by mouth once for 1 dose. Take 2 hours prior to your CT scan.    Dispense:  2 tablet    Refill:  0    Patient Instructions  Medication Instructions:   Your physician recommends that you continue on your current medications as directed. Please refer to the Current Medication list given to you today.  *If you need a refill on your cardiac medications before your next appointment, please call your pharmacy*   Lab Work:  None ordered  If you have labs (blood work) drawn today and your tests are completely normal, you will receive your results only by: MyChart Message (if you have MyChart) OR A paper copy in the mail If you have any lab test that is abnormal or we need to  change your treatment, we will call you to review the results.   Testing/Procedures:   Your physician has requested that you have an echocardiogram in 3 weeks. Echocardiography is a painless test that uses sound waves to create images of your heart. It provides your doctor with information about the size and shape of your heart and how well your hearts chambers and valves are working. This procedure takes  approximately one hour. There are no restrictions for this procedure.  2.   Your physician has requested that you have cardiac CT. Cardiac computed tomography (CT) is a painless test that uses an x-ray machine to take clear, detailed pictures of your heart.    Your cardiac CT will be scheduled at:  [facility] [address] [place], [place] [address] [phone]  Thursday March 2nd at 8:00am  Please arrive 15 mins early for check-in and test prep.    Please follow these instructions carefully (unless otherwise directed):    On the Night Before the Test: Be sure to Drink plenty of water. Do not consume any caffeinated/decaffeinated beverages or chocolate 12 hours prior to your test.    On the Day of the Test: Drink plenty of water until 1 hour prior to the test. Do not eat any food 4 hours prior to the test. You may take your regular medications prior to the test.  Take metoprolol (Lopressor) two hours prior to test. Take Ivabradine (Corlanor) 15 MG two hours prior to test.        After the Test: Drink plenty of water. After receiving IV contrast, you may experience a mild flushed feeling. This is normal. On occasion, you may experience a mild rash up to 24 hours after the test. This is not dangerous. If this occurs, you can take Benadryl 25 mg and increase your fluid intake. If you experience trouble breathing, this can be serious. If it is severe call 911 IMMEDIATELY. If it is mild, please call our office. If you take  any of these medications: Glipizide/Metformin, Avandament, Glucavance, please do not take 48 hours after completing test unless otherwise instructed.  Please allow 2-4 weeks for scheduling of routine cardiac CTs. Some insurance companies require a pre-authorization which may delay scheduling of this test.   For non-scheduling related questions, please contact the cardiac imaging nurse navigator should you have any questions/concerns: Rockwell Alexandria, Cardiac Imaging Nurse Navigator Larey Brick, Cardiac Imaging Nurse Navigator [facility] Direct Office Dial: [phone]   For scheduling needs, including cancellations and rescheduling, please call [place], [phone].   3.  Your physician has recommended that you wear a Zio XT monitor for 2 weeks. This will be mailed to your home address in 4-5 business days.  This monitor is a medical device that records the hearts electrical activity. Doctors most often use these monitors to diagnose arrhythmias. Arrhythmias are problems with the speed or rhythm of the heartbeat. The monitor is a small device applied to your chest. You can wear one while you do your normal daily activities. While wearing this monitor if you have any symptoms to push the button and record what you felt. Once you have worn this monitor for the period of time provider prescribed (Usually 14 days), you will return the monitor device in the postage paid box. Once it is returned they will download the data collected and provide [place] with a report which the provider will then review and we will call you with those results. Important tips:  Avoid showering during the first 24 hours of wearing the monitor. Avoid excessive sweating to help maximize wear time. Do not submerge the device, no hot tubs, and no swimming pools. Keep any lotions or oils away from the patch. After 24 hours you may shower with the patch on. Take brief showers with your back facing the  shower head.  Do not remove  patch once it has been placed because that will interrupt data and decrease adhesive wear time. Push the button when you have any symptoms and write down what you were feeling. Once you have completed wearing your monitor, remove and place into box which has postage paid and place in your outgoing mailbox.  If for some reason you have misplaced your box then call our office and we can provide another box and/or mail it off for you.     Follow-Up: At [facility], you and your health needs are our priority.  As part of our continuing mission to provide you with exceptional heart care, we have created designated Provider Care Teams.  These Care Teams include your primary Cardiologist (physician) and Advanced Practice Providers (APPs -  Physician Assistants and Nurse Practitioners) who all work together to provide you with the care you need, when you need it.  We recommend signing up for the patient portal called "MyChart".  Sign up information is provided on this After Visit Summary.  MyChart is used to connect with patients for Virtual Visits (Telemedicine).  Patients are able to view lab/test results, encounter notes, upcoming appointments, etc.  Non-urgent messages can be sent to your provider as well.   To learn more about what you can do with MyChart, go to [email or web address].    Your next appointment:   6 week(s)  The format for your next appointment:   In [place]  Provider:   Debbe Odea, MD    Other Instructions     Signed, Debbe Odea, MD  12/31/2021 10:24 AM    [facility]

## 2021-12-31 NOTE — Patient Instructions (Signed)
Medication Instructions:   Your physician recommends that you continue on your current medications as directed. Please refer to the Current Medication list given to you today.  *If you need a refill on your cardiac medications before your next appointment, please call your pharmacy*   Lab Work:  None ordered  If you have labs (blood work) drawn today and your tests are completely normal, you will receive your results only by: MyChart Message (if you have MyChart) OR A paper copy in the mail If you have any lab test that is abnormal or we need to change your treatment, we will call you to review the results.   Testing/Procedures:   Your physician has requested that you have an echocardiogram in 3 weeks. Echocardiography is a painless test that uses sound waves to create images of your heart. It provides your doctor with information about the size and shape of your heart and how well your hearts chambers and valves are working. This procedure takes approximately one hour. There are no restrictions for this procedure.  2.   Your physician has requested that you have cardiac CT. Cardiac computed tomography (CT) is a painless test that uses an x-ray machine to take clear, detailed pictures of your heart.    Your cardiac CT will be scheduled at:  [facility] [address] [place], [place] [address] [phone]  Thursday March 2nd at 8:00am  Please arrive 15 mins early for check-in and test prep.    Please follow these instructions carefully (unless otherwise directed):    On the Night Before the Test: Be sure to Drink plenty of water. Do not consume any caffeinated/decaffeinated beverages or chocolate 12 hours prior to your test.    On the Day of the Test: Drink plenty of water until 1 hour prior to the test. Do not eat any food 4 hours prior to the test. You may take your regular medications prior to the test.  Take  metoprolol (Lopressor) two hours prior to test. Take Ivabradine (Corlanor) 15 MG two hours prior to test.        After the Test: Drink plenty of water. After receiving IV contrast, you may experience a mild flushed feeling. This is normal. On occasion, you may experience a mild rash up to 24 hours after the test. This is not dangerous. If this occurs, you can take Benadryl 25 mg and increase your fluid intake. If you experience trouble breathing, this can be serious. If it is severe call 911 IMMEDIATELY. If it is mild, please call our office. If you take any of these medications: Glipizide/Metformin, Avandament, Glucavance, please do not take 48 hours after completing test unless otherwise instructed.  Please allow 2-4 weeks for scheduling of routine cardiac CTs. Some insurance companies require a pre-authorization which may delay scheduling of this test.   For non-scheduling related questions, please contact the cardiac imaging nurse navigator should you have any questions/concerns: Warren Saunders, Cardiac Imaging Nurse Navigator Warren Saunders, Cardiac Imaging Nurse Navigator [facility] Direct Office Dial: [phone]   For scheduling needs, including cancellations and rescheduling, please call Warren Saunders, [phone].   3.  Your physician has recommended that you wear a Zio XT monitor for 2 weeks. This will be mailed to your home address in 4-5 business days.  This monitor is a medical device that records the hearts electrical activity. Doctors most often use these monitors to diagnose arrhythmias. Arrhythmias are problems with the  speed or rhythm of the heartbeat. The monitor is a small device applied to your chest. You can wear one while you do your normal daily activities. While wearing this monitor if you have any symptoms to push the button and record what you felt. Once you have worn this monitor for the period of time provider prescribed (Usually 14 days),  you will return the monitor device in the postage paid box. Once it is returned they will download the data collected and provide [place] with a report which the provider will then review and we will call you with those results. Important tips:  Avoid showering during the first 24 hours of wearing the monitor. Avoid excessive sweating to help maximize wear time. Do not submerge the device, no hot tubs, and no swimming pools. Keep any lotions or oils away from the patch. After 24 hours you may shower with the patch on. Take brief showers with your back facing the shower head.  Do not remove patch once it has been placed because that will interrupt data and decrease adhesive wear time. Push the button when you have any symptoms and write down what you were feeling. Once you have completed wearing your monitor, remove and place into box which has postage paid and place in your outgoing mailbox.  If for some reason you have misplaced your box then call our office and we can provide another box and/or mail it off for you.     Follow-Up: At [facility], you and your health needs are our priority.  As part of our continuing mission to provide you with exceptional heart care, we have created designated Provider Care Teams.  These Care Teams include your primary Cardiologist (physician) and Advanced Practice Providers (APPs -  Physician Assistants and Nurse Practitioners) who all work together to provide you with the care you need, when you need it.  We recommend signing up for the patient portal called "MyChart".  Sign up information is provided on this After Visit Summary.  MyChart is used to connect with patients for Virtual Visits (Telemedicine).  Patients are able to view lab/test results, encounter notes, upcoming appointments, etc.  Non-urgent messages can be sent to your provider as well.   To learn more about what you can do with MyChart, go to [email or web address].    Your next appointment:    6 week(s)  The format for your next appointment:   In [place]  Provider:   Debbe Odea, MD    Other Instructions

## 2022-01-08 ENCOUNTER — Encounter (HOSPITAL_COMMUNITY): Payer: Self-pay

## 2022-01-08 ENCOUNTER — Telehealth (HOSPITAL_COMMUNITY): Payer: Self-pay | Admitting: Emergency Medicine

## 2022-01-08 NOTE — Telephone Encounter (Signed)
Unable to leave vm  ?Rockwell Alexandria RN Navigator Cardiac Imaging ?[facility] ?[phone] Office  ?[phone] Cell ? ?Will send mychart message ?

## 2022-01-09 ENCOUNTER — Ambulatory Visit
Admission: RE | Admit: 2022-01-09 | Discharge: 2022-01-09 | Disposition: A | Discharge status: Home / Self Care | Payer: BC Managed Care – PPO | Source: Ambulatory Visit | Attending: Cardiology | Admitting: Cardiology

## 2022-01-09 ENCOUNTER — Other Ambulatory Visit: Payer: Self-pay

## 2022-01-09 DIAGNOSIS — R072 Precordial pain: Secondary | ICD-10-CM

## 2022-01-09 MED ORDER — IOHEXOL 350 MG/ML SOLN
100.0000 mL | Freq: Once | INTRAVENOUS | Status: AC | PRN
  Administered 2022-01-09: 100 mL via INTRAVENOUS

## 2022-01-09 MED ORDER — NITROGLYCERIN 0.4 MG SL SUBL
0.8000 mg | SUBLINGUAL_TABLET | Freq: Once | SUBLINGUAL | Status: AC
  Administered 2022-01-09: 0.8 mg via SUBLINGUAL

## 2022-01-09 NOTE — Progress Notes (Signed)
Patient tolerated procedure well. Ambulate w/o difficulty. Denies light headedness or being dizzy. Sitting in chair drinking water provided. Encouraged to drink extra water today and reasoning explained. Verbalized understanding. All questions answered. ABC intact. No further needs. Discharge from procedure area w/o issues.   °

## 2022-01-22 ENCOUNTER — Other Ambulatory Visit: Payer: BC Managed Care – PPO

## 2022-02-07 ENCOUNTER — Telehealth: Payer: Self-pay

## 2022-02-07 NOTE — Telephone Encounter (Addendum)
Called patient to reschedule appointment due to the following: ? ?Pt has upcoming appointment 02/14/2022. ?Pt N/S echo and zio not returned.  ?Please advise if ok to keep future appointment.  ? ?Patient had a normal CCTA. Will try and contact to reschedule Echo and follow up, and inquire about Zio monitor. Patient did not have a VM set up, was unable to leave a message. ?

## 2022-02-14 ENCOUNTER — Ambulatory Visit: Payer: BC Managed Care – PPO | Admitting: Cardiology

## 2022-02-17 ENCOUNTER — Encounter: Payer: Self-pay | Admitting: Cardiology

## 2022-02-17 NOTE — Progress Notes (Signed)
Unable to contact to schedule echocardiogram, letter sent. Order cancelled.  

## 2022-03-17 ENCOUNTER — Encounter (HOSPITAL_COMMUNITY): Payer: Self-pay | Admitting: Emergency Medicine

## 2022-03-17 ENCOUNTER — Emergency Department (HOSPITAL_COMMUNITY): Payer: BC Managed Care – PPO

## 2022-03-17 ENCOUNTER — Other Ambulatory Visit: Payer: Self-pay

## 2022-03-17 ENCOUNTER — Observation Stay (HOSPITAL_COMMUNITY)
Admission: EM | Admit: 2022-03-17 | Discharge: 2022-03-18 | Disposition: A | Discharge status: Home / Self Care | Payer: BC Managed Care – PPO | Attending: Internal Medicine | Admitting: Internal Medicine

## 2022-03-17 DIAGNOSIS — Z6837 Body mass index (BMI) 37.0-37.9, adult: Secondary | ICD-10-CM | POA: Diagnosis not present

## 2022-03-17 DIAGNOSIS — R7989 Other specified abnormal findings of blood chemistry: Secondary | ICD-10-CM

## 2022-03-17 DIAGNOSIS — Z79899 Other long term (current) drug therapy: Secondary | ICD-10-CM | POA: Insufficient documentation

## 2022-03-17 DIAGNOSIS — E66812 Obesity, class 2: Secondary | ICD-10-CM

## 2022-03-17 DIAGNOSIS — E785 Hyperlipidemia, unspecified: Secondary | ICD-10-CM

## 2022-03-17 DIAGNOSIS — R778 Other specified abnormalities of plasma proteins: Secondary | ICD-10-CM | POA: Diagnosis not present

## 2022-03-17 DIAGNOSIS — R072 Precordial pain: Secondary | ICD-10-CM | POA: Diagnosis not present

## 2022-03-17 DIAGNOSIS — R0789 Other chest pain: Principal | ICD-10-CM | POA: Insufficient documentation

## 2022-03-17 DIAGNOSIS — E876 Hypokalemia: Secondary | ICD-10-CM | POA: Insufficient documentation

## 2022-03-17 DIAGNOSIS — R0602 Shortness of breath: Secondary | ICD-10-CM | POA: Diagnosis present

## 2022-03-17 DIAGNOSIS — E669 Obesity, unspecified: Secondary | ICD-10-CM

## 2022-03-17 DIAGNOSIS — I10 Essential (primary) hypertension: Secondary | ICD-10-CM

## 2022-03-17 DIAGNOSIS — R079 Chest pain, unspecified: Secondary | ICD-10-CM | POA: Diagnosis present

## 2022-03-17 LAB — BASIC METABOLIC PANEL
Anion gap: 7 (ref 5–15)
BUN: 19 mg/dL (ref 6–20)
CO2: 25 mmol/L (ref 22–32)
Calcium: 9.3 mg/dL (ref 8.9–10.3)
Chloride: 105 mmol/L (ref 98–111)
Creatinine, Ser: 1.06 mg/dL (ref 0.61–1.24)
GFR, Estimated: >60 mL/min (ref >60)
Glucose, Bld: 101 mg/dL — ABNORMAL HIGH (ref 70–99)
Potassium: 3.3 mmol/L — ABNORMAL LOW (ref 3.5–5.1)
Sodium: 137 mmol/L (ref 135–145)

## 2022-03-17 LAB — TROPONIN I (HIGH SENSITIVITY)
Troponin I (High Sensitivity): 155 ng/L (ref <18)
Troponin I (High Sensitivity): 153 ng/L (ref <18)

## 2022-03-17 LAB — CBC
HCT: 43.5 % (ref 39.0–52.0)
Hemoglobin: 14.8 g/dL (ref 13.0–17.0)
MCH: 29.1 pg (ref 26.0–34.0)
MCHC: 34 g/dL (ref 30.0–36.0)
MCV: 85.6 fL (ref 80.0–100.0)
Platelets: 269 10*3/uL (ref 150–400)
RBC: 5.08 MIL/uL (ref 4.22–5.81)
RDW: 13.5 % (ref 11.5–15.5)
WBC: 6.3 10*3/uL (ref 4.0–10.5)
nRBC: 0 % (ref 0.0–0.2)

## 2022-03-17 MED ORDER — ENOXAPARIN SODIUM 60 MG/0.6ML IJ SOSY
60.0000 mg | PREFILLED_SYRINGE | Freq: Every day | INTRAMUSCULAR | Status: DC
  Administered 2022-03-18: 60 mg via SUBCUTANEOUS
  Filled 2022-03-17 (×2): qty 0.6

## 2022-03-17 MED ORDER — POLYETHYLENE GLYCOL 3350 17 G PO PACK
17.0000 g | PACK | Freq: Every day | ORAL | Status: DC | PRN
Start: 2022-03-17 — End: 2022-03-18

## 2022-03-17 MED ORDER — POTASSIUM CHLORIDE CRYS ER 20 MEQ PO TBCR
40.0000 meq | EXTENDED_RELEASE_TABLET | Freq: Two times a day (BID) | ORAL | Status: DC
  Administered 2022-03-18: 40 meq via ORAL
  Filled 2022-03-17 (×2): qty 2

## 2022-03-17 MED ORDER — PROCHLORPERAZINE EDISYLATE 10 MG/2ML IJ SOLN: 10.0000 mg | Freq: Four times a day (QID) | INTRAMUSCULAR | Status: DC | PRN

## 2022-03-17 MED ORDER — ASPIRIN 325 MG PO TABS
325.0000 mg | ORAL_TABLET | ORAL | Status: AC
  Administered 2022-03-18: 325 mg via ORAL
  Filled 2022-03-17: qty 1

## 2022-03-17 MED ORDER — ACETAMINOPHEN 325 MG PO TABS: 650.0000 mg | ORAL_TABLET | Freq: Four times a day (QID) | ORAL | Status: DC | PRN

## 2022-03-17 MED ORDER — MELATONIN 3 MG PO TABS: 3.0000 mg | ORAL_TABLET | Freq: Every evening | ORAL | Status: DC | PRN

## 2022-03-17 MED ORDER — MORPHINE SULFATE (PF) 2 MG/ML IV SOLN: 2.0000 mg | INTRAVENOUS | Status: DC | PRN

## 2022-03-17 NOTE — ED Triage Notes (Signed)
Patient presents via EMS from work with complaints of shortness of breath and chest pressure. When EMS arrived they found him laying on the floor. He has had previous episodes that were related to anxiety and high blood pressure. He has been taking medication for his blood pressure, but has not been taking his PRN anxiety medication in a month. Patient also reports not sleeping well at night.  ? ? ? ?EMS vitals: ?100% SPO2 on room air ?83 CBG ?150/90 BP ? ?

## 2022-03-17 NOTE — ED Provider Triage Note (Signed)
Emergency Medicine Provider Triage Evaluation Note ? ?Warren Saunders , a 26 y.o. male  was evaluated in triage.  Pt complains of anxiety and chest tightness started around 3:00 when he was at work.  No known instigator.  Hx of anxiety and anxiety attacks.  Also Hx of HTN, is taking his BP meds as prescribed.  Has not been taking his anxiety medication of hydroxyzine in the last month.  4 ED visits in the last 3 months.  Denies nausea, vomiting, constipation, diarrhea, fever, dizziness, vision changes, headache lightheadedness, syncope.  Denies recent alcohol or drug use in the last 24 to 48 hours. ? ?Review of Systems  ?Positive: As above ?Negative: As above ? ?Physical Exam  ?BP (!) 154/100 (BP Location: Left Arm)   Pulse 74   Temp 98.5 ?F (36.9 ?C) (Oral)   Resp 16   SpO2 97%  ?Gen:   Awake, no distress, soft-spoken ?Resp:  Normal effort, CTAB, bradypnea ?MSK:   Moves extremities without difficulty  ?Other:  Denies SI/HI or AVH.  RRR without M/R/G. ? ?Medical Decision Making  ?Medically screening exam initiated at 5:45 PM.  Appropriate orders placed.  Warren Saunders was informed that the remainder of the evaluation will be completed by another provider, this initial triage assessment does not replace that evaluation, and the importance of remaining in the ED until their evaluation is complete. ? ?Labs, imaging, EKG ordered. ?  ?Cecil Cobbs, PA-C ?03/17/22 1755 ? ?

## 2022-03-17 NOTE — ED Provider Notes (Signed)
?[facility] DEPT ?Provider Note ? ? ?CSN: [id number] ?Arrival date & time: 03/17/22  1714 ? ?  ? ?History ? ?No chief complaint on file. ? ? ?Warren Saunders is a 26 y.o. male who presents emergency department complaining of shortness of breath and chest tightness starting about 3 PM this afternoon.  Patient has history of anxiety and hypertension, and states he has had previous episodes of similar symptoms related to either 1 of these.  When EMS initially got there he was lying on the floor.  He reports having taken his blood pressure medication this morning, but has not been taking his as needed anxiety medication in about a month. ? ?HPI ? ?  ? ?Home Medications ?Prior to Admission medications   ?Medication Sig Start Date End Date Taking? Authorizing Provider  ?amLODipine (NORVASC) 5 MG tablet Take 5 mg by mouth daily. 10/21/21   [provider]  ?hydrOXYzine (ATARAX) 25 MG tablet Take 1 tablet (25 mg total) by mouth every 6 (six) hours as needed for anxiety. 12/19/21   Warren Larsen, PA-C  ?metoprolol tartrate (LOPRESSOR) 100 MG tablet Take 1 tablet (100 mg total) by mouth once for 1 dose. Take 2 hours prior to your CT scan. 12/31/21 12/31/21  Kate Sable, MD  ?   ? ?Allergies    ?Patient has no known allergies.   ? ?Review of Systems   ?Review of Systems  ?Constitutional:  Negative for fever.  ?Respiratory:  Positive for chest tightness and shortness of breath. Negative for cough.   ?Cardiovascular:  Positive for chest pain. Negative for palpitations and leg swelling.  ?Gastrointestinal:  Negative for abdominal pain, nausea and vomiting.  ?Neurological:  Negative for light-headedness and headaches.  ?All other systems reviewed and are negative. ? ?Physical Exam ?Updated Vital Signs ?BP (!) 135/92   Pulse 68   Temp 98.5 ?F (36.9 ?C) (Oral)   Resp (!) 23   SpO2 97%  ?Physical Exam ?Vitals and nursing note reviewed.  ?Constitutional:   ?   Appearance: Normal appearance.   ?HENT:  ?   Head: Normocephalic and atraumatic.  ?Eyes:  ?   Conjunctiva/sclera: Conjunctivae normal.  ?Cardiovascular:  ?   Rate and Rhythm: Normal rate and regular rhythm.  ?   Pulses:     ?     Radial pulses are 2+ on the right side and 2+ on the left side.  ?     Posterior tibial pulses are 2+ on the right side and 2+ on the left side.  ?Pulmonary:  ?   Effort: Pulmonary effort is normal. No respiratory distress.  ?   Breath sounds: Normal breath sounds.  ?Abdominal:  ?   General: There is no distension.  ?   Palpations: Abdomen is soft.  ?   Tenderness: There is no abdominal tenderness.  ?Musculoskeletal:  ?   Right lower leg: No edema.  ?   Left lower leg: No edema.  ?Skin: ?   General: Skin is warm and dry.  ?Neurological:  ?   General: No focal deficit present.  ?   Mental Status: He is alert.  ? ? ?ED Results / Procedures / Treatments   ?Labs ?(all labs ordered are listed, but only abnormal results are displayed) ?Labs Reviewed  ?BASIC METABOLIC PANEL - Abnormal; Notable for the following components:  ?    Result Value  ? Potassium 3.3 (*)   ? Glucose, Bld 101 (*)   ? All other components  within normal limits  ?TROPONIN I (HIGH SENSITIVITY) - Abnormal; Notable for the following components:  ? Troponin I (High Sensitivity) 155 (*)   ? All other components within normal limits  ?TROPONIN I (HIGH SENSITIVITY) - Abnormal; Notable for the following components:  ? Troponin I (High Sensitivity) 153 (*)   ? All other components within normal limits  ?CBC  ?RAPID URINE DRUG SCREEN, HOSP PERFORMED  ?SEDIMENTATION RATE  ?C-REACTIVE PROTEIN  ? ? ?EKG ?EKG Interpretation ? ?Date/Time:  Monday Mar 17 2022 17:21:28 EDT ?Ventricular Rate:  75 ?PR Interval:  192 ?QRS Duration: 99 ?QT Interval:  361 ?QTC Calculation: 404 ?R Axis:   77 ?Text Interpretation: Sinus rhythm RSR' in V1 or V2, right VCD or RVH ST elev, probable normal early repol pattern No significant change since last tracing Confirmed by Deno Etienne [phone]) on  03/17/2022 7:10:02 PM ? ?Radiology ?DG Chest 2 View ? ?Result Date: 03/17/2022 ?CLINICAL DATA:  Chest pain EXAM: CHEST - 2 VIEW COMPARISON:  None Available. FINDINGS: The heart size and mediastinal contours are within normal limits. Both lungs are clear. The visualized skeletal structures are unremarkable. IMPRESSION: No active cardiopulmonary disease. Electronically Signed   By: Fidela Salisbury M.D.   On: 03/17/2022 17:48   ? ?Procedures ?Procedures  ? ? ?Medications Ordered in ED ?Medications - No data to display ? ?ED Course/ Medical Decision Making/ A&P ?  ?                        ?Medical Decision Making ?Amount and/or Complexity of Data Reviewed ?Labs: ordered. ?Radiology: ordered. ? ?Risk ?Decision regarding hospitalization. ? ?This patient presents to the ED for concern of chest pain, this involves an extensive number of treatment options, and is a complaint that carries with it a high risk of complications and morbidity. The emergent differential diagnosis prior to evaluation includes, but is not limited to,  ACS, pericarditis, aortic dissection, PE, pneumothorax, esophageal spasm or rupture, chronic angina, valvular disease, cardiomyopathy, myocarditis, pulmonary HTN, pneumonia, bronchitis, GERD, reflux/PUD, biliary disease, pancreatitis, disk disease or arthritis, costochondritis, anxiety or panic attack, herpes zoster, breast disorders, chest wall tumors. This is not an exhaustive differential.  ? ?Past Medical History / Co-morbidities / Social History: ?Anxiety, hypertension ? ?Additional history: ?Chart reviewed. Pertinent results include: Patient had normal CT coronaries on 3/2 of this year, there is no evidence of CAD.  He was determined to be low risk for coronary events.  Compared patient's prior labs, has no history of elevated troponins. ? ?Physical Exam: ?Physical exam performed. The pertinent findings include: Normal vital signs. Heart regular rate and rhythm. Lung sounds clear. No leg pain or  swelling.  ? ?Lab Tests: ?I ordered, and personally interpreted labs.  The pertinent results include: No leukocytosis, normal hemoglobin.  Mild hypokalemia 3.3.  Initial troponin 155, delta troponin 153. ?  ?Imaging Studies: ?I ordered imaging studies including chest x-ray. I independently visualized and interpreted imaging which showed no acute cardiopulmonary abnormalities. I agree with the radiologist interpretation. ?  ?Cardiac Monitoring:  ?The patient was maintained on a cardiac monitor.  My attending physician Dr. Tyrone Nine viewed and interpreted the cardiac monitored which showed an underlying rhythm of: sinus rhythm with no significant changes from last tracing. I agree with this interpretation.  ?  ?Consultations Obtained: ?I requested consultation with the cardiologist Dr. Renella Cunas,  and discussed lab and imaging findings as well as pertinent plan - they recommend: medical admission for observation,  serial troponin's, and echocardiogram in the morning. Cardiology to see on the floor. ? ?I consulted hospitalist Dr. Nevada Crane who will admit the patient.  ?  ?Disposition: ?After consideration of the diagnostic results and the patients response to treatment, I feel that patient is requiring medical admission and cardiology consultation.  ? ?I discussed this case with my attending physician Dr. Tyrone Nine who cosigned this note including patient's presenting symptoms, physical exam, and planned diagnostics and interventions. Attending physician stated agreement with plan or made changes to plan which were implemented.    ? ?Final Clinical Impression(s) / ED Diagnoses ?Final diagnoses:  ?Chest pain, unspecified type  ?Elevated troponin  ? ? ?Rx / DC Orders ?ED Discharge Orders   ? ? None  ? ?  ? ?Portions of this report may have been transcribed using voice recognition software. Every effort was made to ensure accuracy; however, inadvertent computerized transcription errors may be present. ? ?  ?Kynzee Devinney T,  PA-C ?03/17/22 2155 ? ?  ?Deno Etienne, DO ?03/17/22 2232 ? ?

## 2022-03-17 NOTE — H&P (Addendum)
?History and Physical ? ?Warren Saunders [id number] DOB: 1996-10-15 DOA: 03/17/2022 ? ?Referring physician: [place], PA-EDP  ?PCP: System, Provider Not In  ?Outpatient Specialists: Cardiology ?Patient coming from: Home ? ?Chief Complaint: Chest pain ? ?HPI: Warren Saunders is a 26 y.o. male with medical history significant for essential hypertension on Norvasc, chest pain prior to syncope while at work in February 2023, followed by cardiology outpatient, THC use, occasional alcohol use, chronic anxiety on hydroxyzine as needed, who presents to [facility] ED from work with complaints of nonexertional chest pain, tightness, centrally located, nonradiating, severe 10 out of 10.  Associated with shortness of breath at rest.  No palpitations.  EMS was activated.  No known family history of premature coronary artery disease.  Last alcohol use was on Friday.  Upon presentation to the ED, no evidence of acute ischemia on twelve-lead EKG however his high-sensitivity troponins were elevated and peaked at 155.  Received a full dose of aspirin in the ED.  EDP discussed case with cardiology, Dr. Renella Cunas, recommended admission for observation and to obtain 2D echo in the morning, they will see in consultation.  Patient admitted by hospitalist service, [facility].   ? ?At the time of this visit, the patient's chest pain was 1 out of 10. ? ?ED Course: Tmax 98.5.  BP 146/86, pulse 59, respiratory rate 20, with saturation 97% on room air.  Lab studies remarkable for serum potassium 3.3, glucose 101, high-sensitivity troponin 155, 153, 127.  CRP 1.0, sed rate 20. ? ?Review of Systems: ?Review of systems as noted in the HPI. All other systems reviewed and are negative. ? ? ?Past Medical History:  ?Diagnosis Date  ? Anxiety   ? Hypertension   ? Seizure ([place])   ? ?Past Surgical History:  ?Procedure Laterality Date  ? ARTHROSCOPIC REPAIR ACL    ? ? ?Social History:  reports that he has never smoked. He has never used smokeless tobacco. He reports  current alcohol use. He reports current drug use. Drug: Marijuana. ? ? ?No Known Allergies ? ?Family history: ?Mother with history of hypertension ?No known family history of premature coronary artery disease. ? ?Prior to Admission medications   ?Medication Sig Start Date End Date Taking? Authorizing Provider  ?amLODipine (NORVASC) 5 MG tablet Take 5 mg by mouth daily. 10/21/21   [provider]  ?hydrOXYzine (ATARAX) 25 MG tablet Take 1 tablet (25 mg total) by mouth every 6 (six) hours as needed for anxiety. 12/19/21   Jacqlyn Larsen, PA-C  ?metoprolol tartrate (LOPRESSOR) 100 MG tablet Take 1 tablet (100 mg total) by mouth once for 1 dose. Take 2 hours prior to your CT scan. 12/31/21 12/31/21  Kate Sable, MD  ? ? ?Physical Exam: ?BP (!) 135/92   Pulse 68   Temp 98.5 ?F (36.9 ?C) (Oral)   Resp (!) 23   SpO2 97%  ? ?General: 26 y.o. year-old male well developed well nourished in no acute distress.  Alert and oriented x3. ?Cardiovascular: Regular rate and rhythm with no rubs or gallops.  No thyromegaly or JVD noted.  No lower extremity edema. 2/4 pulses in all 4 extremities. ?Respiratory: Clear to auscultation with no wheezes or rales. Good inspiratory effort. ?Abdomen: Soft nontender nondistended with normal bowel sounds x4 quadrants. ?Muskuloskeletal: No cyanosis, clubbing or edema noted bilaterally ?Neuro: CN II-XII intact, strength, sensation, reflexes ?Skin: No ulcerative lesions noted or rashes ?Psychiatry: Judgement and insight appear normal. Mood is appropriate for condition and setting ?   ?   ?   ?  Labs on Admission:  ?Basic Metabolic Panel: ?Recent Labs  ?Lab 03/17/22 ?1809  ?NA 137  ?K 3.3*  ?CL 105  ?CO2 25  ?GLUCOSE 101*  ?BUN 19  ?CREATININE 1.06  ?CALCIUM 9.3  ? ?Liver Function Tests: ?No results for input(s): AST, ALT, ALKPHOS, BILITOT, PROT, ALBUMIN in the last 168 hours. ?No results for input(s): LIPASE, AMYLASE in the last 168 hours. ?No results for input(s): AMMONIA in the last  168 hours. ?CBC: ?Recent Labs  ?Lab 03/17/22 ?1809  ?WBC 6.3  ?HGB 14.8  ?HCT 43.5  ?MCV 85.6  ?PLT 269  ? ?Cardiac Enzymes: ?No results for input(s): CKTOTAL, CKMB, CKMBINDEX, TROPONINI in the last 168 hours. ? ?BNP (last 3 results) ?No results for input(s): BNP in the last 8760 hours. ? ?ProBNP (last 3 results) ?No results for input(s): PROBNP in the last 8760 hours. ? ?CBG: ?No results for input(s): GLUCAP in the last 168 hours. ? ?Radiological Exams on Admission: ?DG Chest 2 View ? ?Result Date: 03/17/2022 ?CLINICAL DATA:  Chest pain EXAM: CHEST - 2 VIEW COMPARISON:  None Available. FINDINGS: The heart size and mediastinal contours are within normal limits. Both lungs are clear. The visualized skeletal structures are unremarkable. IMPRESSION: No active cardiopulmonary disease. Electronically Signed   By: Fidela Salisbury M.D.   On: 03/17/2022 17:48   ? ?EKG: I independently viewed the EKG done and my findings are as followed: Sinus rhythm rate of 75.  Nonspecific ST-T changes.  QTc 404. ? ?Assessment/Plan ?Present on Admission: ? Chest pain ? ?Principal Problem: ?  Chest pain ? ?Atypical chest pain, rule out ACS. ?High-sensitivity troponin 155 on presentation, down trended. ?Received a full dose of aspirin in the ED. ?Trend high-sensitivity troponin ?Obtain serial twelve-lead EKG, pending ?At the time of this visit chest pain 1 out of 10. ?Obtain fasting lipid panel ?Obtain 2D echo and follow results ? ?Hypokalemia ?Serum potassium 3.3 ?Repleted orally with 40 mEq KCl x2 doses. ?Repeat BMP and magnesium level in the morning. ? ?THC use and alcohol use disorder ?Polysubstance use cessation counseling done at bedside ?Drinks alcohol occasionally, no evidence of alcohol withdrawal ?UDS positive for THC on 12/23/21, repeat UDS is pending. ? ?Hypertension ?Patient is on Norvasc 5 mg daily ?Resume home regimen ?Monitor vital signs ?Hold off calcium channel blocker if low EF on 2D echo ? ?Chronic anxiety ?Obtain TSH ?Resume  home hydroxyzine as needed ? ?Obesity ?BMI 37 ?Recommend weight loss outpatient with regular physical activity and healthy dieting. ? ? ? ?DVT prophylaxis: Subcu Lovenox daily ? ?Code Status: Full code ? ?Family Communication: None at bedside ? ?Disposition Plan: Admitted to telemetry unit ? ?Consults called: Cardiology consulted by EDP ? ?Admission status: Observation status. ? ? ?Status is: Observation ? ? ? ?Kayleen Memos MD ?[facility] ?Pager [phone] ? ?If 7PM-7AM, please contact night-coverage ?[email or web address] ?Password [email or web address] ? ?03/17/2022, 10:29 PM   ?

## 2022-03-18 ENCOUNTER — Observation Stay (HOSPITAL_BASED_OUTPATIENT_CLINIC_OR_DEPARTMENT_OTHER): Payer: BC Managed Care – PPO

## 2022-03-18 ENCOUNTER — Observation Stay (HOSPITAL_COMMUNITY): Payer: BC Managed Care – PPO

## 2022-03-18 DIAGNOSIS — E669 Obesity, unspecified: Secondary | ICD-10-CM

## 2022-03-18 DIAGNOSIS — R079 Chest pain, unspecified: Secondary | ICD-10-CM | POA: Diagnosis not present

## 2022-03-18 DIAGNOSIS — R778 Other specified abnormalities of plasma proteins: Secondary | ICD-10-CM

## 2022-03-18 DIAGNOSIS — E785 Hyperlipidemia, unspecified: Secondary | ICD-10-CM

## 2022-03-18 DIAGNOSIS — I10 Essential (primary) hypertension: Secondary | ICD-10-CM

## 2022-03-18 DIAGNOSIS — I160 Hypertensive urgency: Secondary | ICD-10-CM

## 2022-03-18 DIAGNOSIS — R072 Precordial pain: Secondary | ICD-10-CM | POA: Diagnosis not present

## 2022-03-18 DIAGNOSIS — F419 Anxiety disorder, unspecified: Secondary | ICD-10-CM

## 2022-03-18 DIAGNOSIS — R7989 Other specified abnormal findings of blood chemistry: Secondary | ICD-10-CM

## 2022-03-18 DIAGNOSIS — R609 Edema, unspecified: Secondary | ICD-10-CM

## 2022-03-18 LAB — ECHOCARDIOGRAM COMPLETE
Area-P 1/2: 2.73 cm2
Calc EF: 35.9 %
Height: 75 in
S' Lateral: 3.7 cm
Single Plane A2C EF: 48.5 %
Single Plane A4C EF: 32.7 %
Weight: 4804.26 oz

## 2022-03-18 LAB — CBC
HCT: 42.7 % (ref 39.0–52.0)
Hemoglobin: 14.3 g/dL (ref 13.0–17.0)
MCH: 28.4 pg (ref 26.0–34.0)
MCHC: 33.5 g/dL (ref 30.0–36.0)
MCV: 84.7 fL (ref 80.0–100.0)
Platelets: 270 10*3/uL (ref 150–400)
RBC: 5.04 MIL/uL (ref 4.22–5.81)
RDW: 13.4 % (ref 11.5–15.5)
WBC: 6.4 10*3/uL (ref 4.0–10.5)
nRBC: 0 % (ref 0.0–0.2)

## 2022-03-18 LAB — SEDIMENTATION RATE: Sed Rate: 20 mm/hr — ABNORMAL HIGH (ref 0–16)

## 2022-03-18 LAB — TSH: TSH: 2.236 u[IU]/mL (ref 0.350–4.500)

## 2022-03-18 LAB — TROPONIN I (HIGH SENSITIVITY): Troponin I (High Sensitivity): 127 ng/L (ref <18)

## 2022-03-18 LAB — LIPID PANEL
Cholesterol: 159 mg/dL (ref 0–200)
HDL: 33 mg/dL — ABNORMAL LOW (ref >40)
LDL Cholesterol: 106 mg/dL — ABNORMAL HIGH (ref 0–99)
Total CHOL/HDL Ratio: 4.8 RATIO
Triglycerides: 102 mg/dL (ref <150)
VLDL: 20 mg/dL (ref 0–40)

## 2022-03-18 LAB — CREATININE, SERUM
Creatinine, Ser: 0.95 mg/dL (ref 0.61–1.24)
GFR, Estimated: >60 mL/min (ref >60)

## 2022-03-18 LAB — D-DIMER, QUANTITATIVE: D-Dimer, Quant: 0.83 ug/mL-FEU — ABNORMAL HIGH (ref 0.00–0.50)

## 2022-03-18 LAB — HIV ANTIBODY (ROUTINE TESTING W REFLEX): HIV Screen 4th Generation wRfx: NONREACTIVE

## 2022-03-18 LAB — C-REACTIVE PROTEIN: CRP: 1 mg/dL — ABNORMAL HIGH (ref <1.0)

## 2022-03-18 MED ORDER — SODIUM CHLORIDE (PF) 0.9 % IJ SOLN
INTRAMUSCULAR | Status: AC
  Filled 2022-03-18: qty 50

## 2022-03-18 MED ORDER — IOHEXOL 350 MG/ML SOLN
100.0000 mL | Freq: Once | INTRAVENOUS | Status: AC | PRN
  Administered 2022-03-18: 100 mL via INTRAVENOUS

## 2022-03-18 MED ORDER — AMLODIPINE BESYLATE 10 MG PO TABS: 10.0000 mg | ORAL_TABLET | Freq: Every day | ORAL | 0 refills | Status: AC

## 2022-03-18 MED ORDER — HYDROXYZINE HCL 25 MG PO TABS: 25.0000 mg | ORAL_TABLET | Freq: Three times a day (TID) | ORAL | Status: DC | PRN

## 2022-03-18 MED ORDER — SODIUM CHLORIDE 0.9 % IV SOLN: INTRAVENOUS | Status: DC

## 2022-03-18 MED ORDER — AMLODIPINE BESYLATE 5 MG PO TABS
5.0000 mg | ORAL_TABLET | Freq: Every day | ORAL | Status: DC
  Administered 2022-03-18: 5 mg via ORAL
  Filled 2022-03-18: qty 1

## 2022-03-18 MED ORDER — PERFLUTREN LIPID MICROSPHERE
1.0000 mL | INTRAVENOUS | Status: AC | PRN
  Administered 2022-03-18: 2 mL via INTRAVENOUS
  Filled 2022-03-18: qty 10

## 2022-03-18 MED ORDER — AMLODIPINE BESYLATE 10 MG PO TABS: 10.0000 mg | ORAL_TABLET | Freq: Every day | ORAL | Status: DC

## 2022-03-18 NOTE — Progress Notes (Signed)
Bilateral lower extremity venous duplex has been completed. ?Preliminary results can be found in CV Proc through chart review.  ?Results were given to the patient's nurse, Diamone. ? ?03/18/22 3:05 PM ?Olen Cordial RVT   ?

## 2022-03-18 NOTE — Discharge Summary (Signed)
Physician Discharge Summary  ?Warren Saunders SKA:[id number] DOB: 1996-10-03 DOA: 03/17/2022 ? ?PCP: System, Provider Not In ? ?Admit date: 03/17/2022 ?Discharge date: 03/18/2022 ?Recommendations for Outpatient Follow-up:  ?Follow up with PCP in 1 weeks-call for appointment ?Please obtain BMP/CBC in one week ? ?Discharge Dispo: Home ?Discharge Condition: Stable ?Code Status:   Code Status: Full Code ?Diet recommendation:  ?Diet Order   ? ?       ?  Diet regular Room service appropriate? Yes; Fluid consistency: Thin  Diet effective now       ?  ? ?  ?  ? ?  ? ?Brief/Interim Summary: 26 y.o. male with medical history significant for essential hypertension on Norvasc, chest pain prior to syncope while at work in February 2023, followed by cardiology outpatient, THC use, occasional alcohol use, chronic anxiety on hydroxyzine as needed, who presents to [facility] ED from work with complaints of nonexertional chest pain,tightness,centrally located, nonradiating, severe 10 out of 10. Associated with shortness of breath at rest.No palpitations.  EMS was activated.  No known family history of premature coronary artery disease.Last alcohol use was on Friday.  Upon presentation to the ED, no evidence of acute ischemia on twelve-lead EKG however his high-sensitivity troponins were elevated and peaked at 155.Received a full dose of aspirin in the ED. in the ED vitals stable saturating 97% room air, high-sensitivity troponin 155, 153, 127.  CRP 1.0, sed rate 20.EDP discussed case with cardiology,recommended admission for observation and to obtain 2D echo in the morning, they will see in consultation.Patient admitted by hospitalist service. D-dimer was 0.83.His chest pain has essentially resolved since admission.No shortness of breath no leg swelling.Discussed with cardiology echo reassuring-given + d dimer, + troponin,chest pain - CTA/ Duplex leg ordered to rule out VTE.If no PE he will be discharged home with instruction to follow up PCP. CTA  Neg for PE and duplex neg for DVT.He is being discharged home. ?  ?Discharge Diagnoses:  ?Chest pain,atypical-positive trops-flat ?etiolgy. EKG-no acute ischemia.She had CT coronaries-no CAD.  General cardiology will forego further work-up recommended. Once echocardiograms resulted patient can be discharged. ? ?Hypertension: Amlodipine dosing increased.  Consider Coreg if BP is  uncontrolled ?Hyperlipidemia-fu with PCP ?Positive D dimer-negative CTPA, and negative D-dimer ?Obesity, Class II, BMI 35-39.9-outpatient follow-up with PCP ? ?Consults: ?Cardiology ? ?Subjective: ?Aaox3, No Chest pain. No leg pain. ? ?Discharge Exam: ?Vitals:  ? 03/18/22 0852 03/18/22 1334  ?BP: 133/80 (!) 146/90  ?Pulse: (!) 59 (!) 59  ?Resp: 16 16  ?Temp: (!) 97.4 ?F (36.3 ?C) 98 ?F (36.7 ?C)  ?SpO2: 100% 100%  ? ?General: Pt is alert, awake, not in acute distress ?Cardiovascular: RRR, S1/S2 +, no rubs, no gallops ?Respiratory: CTA bilaterally, no wheezing, no rhonchi ?Abdominal: Soft, NT, ND, bowel sounds + ?Extremities: no edema, no cyanosis ? ?Discharge Instructions ? ?Discharge Instructions   ? ? Discharge instructions   Complete by: As directed ?  ? Please call call MD or return to ER for similar or worsening recurring problem that brought you to hospital or if any fever,nausea/vomiting,abdominal pain, uncontrolled pain, chest pain,  shortness of breath or any other alarming symptoms. ? ?Please follow-up your doctor as instructed in a week time and call the office for appointment. ? ?Please avoid alcohol, smoking, or any other illicit substance and maintain healthy habits including taking your regular medications as prescribed. ? ?You were cared for by a hospitalist during your hospital stay. If you have any questions about your discharge medications  or the care you received while you were in the hospital after you are discharged, you can call the unit and ask to speak with the hospitalist on call if the hospitalist that took care of  you is not available. ? ?Once you are discharged, your primary care physician will handle any further medical issues. Please note that NO REFILLS for any discharge medications will be authorized once you are discharged, as it is imperative that you return to your primary care physician (or establish a relationship with a primary care physician if you do not have one) for your aftercare needs so that they can reassess your need for medications and monitor your lab values  ? Increase activity slowly   Complete by: As directed ?  ? ?  ? ?Allergies as of 03/18/2022   ?No Known Allergies ?  ? ?  ?Medication List  ?  ? ?TAKE these medications   ? ?amLODipine 10 MG tablet ?Commonly known as: NORVASC ?Take 1 tablet (10 mg total) by mouth daily. ?Start taking on: Mar 19, 2022 ?What changed:  ?medication strength ?how much to take ?  ?hydrOXYzine 25 MG tablet ?Commonly known as: ATARAX ?Take 1 tablet (25 mg total) by mouth every 6 (six) hours as needed for anxiety. ?  ?LORazepam 1 MG tablet ?Commonly known as: ATIVAN ?Take 1 mg by mouth daily as needed for anxiety. ?  ? ?  ? ? Follow-up Information   ? ? Sondra Barges, PA-C Follow up on 03/31/2022.   ?Specialties: Physician Assistant, Cardiology, Radiology ?Why: at 10:05AM for your post hospital cardiology follow up appointment ?Contact information: ?[address] ?[address] ?[place] [address] ?[phone] ? ? ?  ?  ? ? Debbe Odea, MD Follow up in 1 week(s).   ?Specialties: Cardiology, Radiology ?Contact information: ?[address] ?[place] [address] ?[phone] ? ? ?  ?  ? ?  ?  ? ?  ? ?No Known Allergies ? ?The results of significant diagnostics from this hospitalization (including imaging, microbiology, ancillary and laboratory) are listed below for reference.   ? ?Microbiology: ?No results found for this or any previous visit (from the past 240 hour(s)).  ?Procedures/Studies: ?DG Chest 2 View ? ?Result Date: 03/17/2022 ?CLINICAL DATA:  Chest pain  EXAM: CHEST - 2 VIEW COMPARISON:  None Available. FINDINGS: The heart size and mediastinal contours are within normal limits. Both lungs are clear. The visualized skeletal structures are unremarkable. IMPRESSION: No active cardiopulmonary disease. Electronically Signed   By: Helyn Numbers M.D.   On: 03/17/2022 17:48  ? ?CT Angio Chest Pulmonary Embolism (PE) W or WO Contrast ? ?Result Date: 03/18/2022 ?CLINICAL DATA:  A 26 year old male presents for evaluation of chest pain with elevated D-dimer. EXAM: CT ANGIOGRAPHY CHEST WITH CONTRAST TECHNIQUE: Multidetector CT imaging of the chest was performed using the standard protocol during bolus administration of intravenous contrast. Multiplanar CT image reconstructions and MIPs were obtained to evaluate the vascular anatomy. RADIATION DOSE REDUCTION: This exam was performed according to the departmental dose-optimization program which includes automated exposure control, adjustment of the mA and/or kV according to patient size and/or use of iterative reconstruction technique. CONTRAST:  OMNIPAQUE IOHEXOL 350 MG/ML SOLN COMPARISON:  Cardiac CT evaluation from January 09, 2022. FINDINGS: Cardiovascular: Limited assessment of the aorta a due to phase of contrast enhancement. Normal caliber and smooth contour without adjacent stranding of the thoracic aorta. Normal heart size without pericardial effusion or thickening. Normal caliber of central pulmonary  vessels. Study mildly limited by bolus timing and respiratory motion. Density of main pulmonary artery at 262 Hounsfield units. Negative for pulmonary embolism to the segmental level. Mediastinum/Nodes: Normal esophagus. 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 No adenopathy. Lungs/Pleura: Lungs are clear. Airways are patent. No effusion. No pneumothorax. Upper Abdomen: Incidental imaging of upper abdominal contents without acute process related to liver, gallbladder, pancreas, spleen, adrenal glands, kidneys and  gastrointestinal tract to the extent evaluated, these structures are incompletely imaged. Musculoskeletal: Gynecomastia. No acute bone finding or destructive bone process. Review of the MIP images confirms the

## 2022-03-18 NOTE — Hospital Course (Addendum)
26 y.o. male with medical history significant for essential hypertension on Norvasc, chest pain prior to syncope while at work in February 2023, followed by cardiology outpatient, THC use, occasional alcohol use, chronic anxiety on hydroxyzine as needed, who presents to [facility] ED from work with complaints of nonexertional chest pain,tightness,centrally located, nonradiating, severe 10 out of 10. Associated with shortness of breath at rest.No palpitations.  EMS was activated.  No known family history of premature coronary artery disease.Last alcohol use was on Friday.  Upon presentation to the ED, no evidence of acute ischemia on twelve-lead EKG however his high-sensitivity troponins were elevated and peaked at 155.Received a full dose of aspirin in the ED. in the ED vitals stable saturating 97% room air, high-sensitivity troponin 155, 153, 127.  CRP 1.0, sed rate 20.EDP discussed case with cardiology,recommended admission for observation and to obtain 2D echo in the morning, they will see in consultation.Patient admitted by hospitalist service. D-dimer was 0.83.His chest pain has essentially resolved since admission.No shortness of breath no leg swelling.Discussed with cardiology echo reassuring-given + d dimer, + troponin,chest pain - CTA/ Duplex leg ordered to rule out VTE.If no PE he will be discharged home with instruction to follow up PCP. CTA Neg for PE and duplex neg for DVT.He is being discharged home. ? ?

## 2022-03-18 NOTE — Consult Note (Addendum)
?Cardiology Consultation:  ? ?Patient ID: Warren Saunders ?MRN: [id number]; DOB: 10-May-1996 ? ?Admit date: 03/17/2022 ?Date of Consult: 03/18/2022 ? ?PCP:  System, Provider Not In ?  ?[facility] Providers ?Cardiologist:  Warren Sable, MD   { ? ? ?Patient Profile:  ? ?Warren Saunders is a 26 y.o. male with a hx of HTN, THC abuse, anxiety, who is being seen 03/18/2022 for the evaluation of chest pain at the request of Dr Warren Saunders. ? ?History of Present Illness:  ? ?Mr. Nevel with above PMH presented to ER yesterday with c/o chest pain. He works as a Scientist, water quality. He developed sudden onset of mid-sternum chest pain yesterday at work. Pain is pressure like and not radiating. Pain is associated with SOB. He denied any diaphoresis, dizziness, syncope, paresthesia. He states pain is not triggered by exertion, not relieved by resting. Pain spontaneously resolved after 1 hour. He recalls similar pain in Feb 2023 when he saw cardiology. He denied any tobacco use, ETOH use. He denied family history of cardiac disease. He is chest pain free today.  ? ?Admission diagnostics showed hypokalemia 3.3, CBC unremarkable. Hs trop 155 >153 >127. D dimer 0.83. TSH WNL. CXR no acute findings. EKG with SR 75bpm, no acute changes. He is admitted to [place], cardiology is consulted today.  ? ?He follows Dr Warren Saunders outpatient, was seen on 12/31/21 for chest pain, symptoms was associated with anxiety. Echo was recommended but not done, CTA coronary study from 01/2022 showed coronary calcium score of 0 and no evidence of CAD.  ? ? ?Past Medical History:  ?Diagnosis Date  ? Anxiety   ? Hypertension   ? Seizure ([place])   ? ? ?Past Surgical History:  ?Procedure Laterality Date  ? ARTHROSCOPIC REPAIR ACL    ?  ? ?Home Medications:  ?Prior to Admission medications   ?Medication Sig Start Date End Date Taking? Authorizing Provider  ?amLODipine (NORVASC) 5 MG tablet Take 5 mg by mouth daily. 10/21/21   [provider]  ?hydrOXYzine  (ATARAX) 25 MG tablet Take 1 tablet (25 mg total) by mouth every 6 (six) hours as needed for anxiety. 12/19/21   Warren Larsen, PA-C  ?metoprolol tartrate (LOPRESSOR) 100 MG tablet Take 1 tablet (100 mg total) by mouth once for 1 dose. Take 2 hours prior to your CT scan. 12/31/21 12/31/21  Warren Sable, MD  ? ? ?Inpatient Medications: ?Scheduled Meds: ? [START ON 03/19/2022] amLODipine  10 mg Oral Daily  ? enoxaparin (LOVENOX) injection  60 mg Subcutaneous QHS  ? potassium chloride  40 mEq Oral BID  ? ?Continuous Infusions: ? sodium chloride 50 mL/hr at 03/18/22 0929  ? sodium chloride 125 mL/hr at 03/18/22 1337  ? ?PRN Meds: ?acetaminophen, hydrOXYzine, melatonin, morphine injection, perflutren lipid microspheres (DEFINITY) IV suspension, polyethylene glycol, prochlorperazine ? ?Allergies:   No Known Allergies ? ?Social History:   ?Social History  ? ?Socioeconomic History  ? Marital status: Single  ?  Spouse name: Not on file  ? Number of children: Not on file  ? Years of education: Not on file  ? Highest education level: Not on file  ?Occupational History  ? Not on file  ?Tobacco Use  ? Smoking status: Never  ? Smokeless tobacco: Never  ?Substance and Sexual Activity  ? Alcohol use: Yes  ? Drug use: Yes  ?  Types: Marijuana  ? Sexual activity: Yes  ?Other Topics Concern  ? Not on file  ?Social History Narrative  ? Not on file  ? ?  Social Determinants of Health  ? ?Financial Resource Strain: Not on file  ?Food Insecurity: Not on file  ?Transportation Needs: Not on file  ?Physical Activity: Not on file  ?Stress: Not on file  ?Social Connections: Not on file  ?Intimate Partner Violence: Not on file  ?  ?Family History:   ?No family history of MIs ? ?ROS:  ?Constitutional: Denied fever, chills, malaise, night sweats ?Eyes: Denied vision change or loss ?Ears/Nose/Mouth/Throat: Denied ear ache, sore throat, coughing, sinus pain ?Cardiovascular: see HPI  ?Respiratory: see HPI  ?Gastrointestinal: Denied nausea, vomiting,  abdominal pain, diarrhea ?Genital/Urinary: Denied dysuria, hematuria, urinary frequency/urgency ?Musculoskeletal: Denied muscle ache, joint pain, weakness ?Skin: Denied rash, wound ?Neuro: Denied headache, dizziness, syncope ?Psych: anxiety   ?Endocrine: Denied history of diabetes ?   ? ?Physical Exam/Data:  ? ?Vitals:  ? 03/18/22 0300 03/18/22 0544 03/18/22 0852 03/18/22 1334  ?BP: 114/60 134/81 133/80 (!) 146/90  ?Pulse:  (!) 57 (!) 59 (!) 59  ?Resp: 16 17 16 16   ?Temp:  97.6 ?F (36.4 ?C) (!) 97.4 ?F (36.3 ?C) 98 ?F (36.7 ?C)  ?TempSrc:  Oral Oral Oral  ?SpO2:  100% 100% 100%  ?Weight:      ?Height:      ? ? ?Intake/Output Summary (Last 24 hours) at 03/18/2022 1401 ?Last data filed at 03/18/2022 0175 ?Gross per 24 hour  ?Intake 126.89 ml  ?Output --  ?Net 126.89 ml  ? ? ?  03/18/2022  ?  1:02 AM 12/31/2021  ?  8:43 AM 12/23/2021  ? 11:50 AM  ?Last 3 Weights  ?Weight (lbs) 300 lb 4.3 oz 289 lb 4 oz 285 lb 0.9 oz  ?Weight (kg) 136.2 kg 131.203 kg 129.3 kg  ?   ?Body mass index is 37.53 kg/m?.  ? ?Vitals:  ?Vitals:  ? 03/18/22 0852 03/18/22 1334  ?BP: 133/80 (!) 146/90  ?Pulse: (!) 59 (!) 59  ?Resp: 16 16  ?Temp: (!) 97.4 ?F (36.3 ?C) 98 ?F (36.7 ?C)  ?SpO2: 100% 100%  ? ?General Appearance: In no apparent distress, laying in bed ?HEENT: Normocephalic, atraumatic.  ?Neck: Supple, trachea midline, no JVDs ?Cardiovascular: Regular rate and rhythm, normal S1-S2,  no murmur/rub/gallop ?Respiratory: Resting breathing unlabored, lungs sounds clear to auscultation bilaterally, no use of accessory muscles. On room air.  No wheezes, rales or rhonchi.   ?Gastrointestinal: abdomen soft, non-tender ?Extremities: Able to move all extremities in bed without difficulty, no edema/cyanosis/clubbing ?Musculoskeletal: Normal muscle bulk and tone ?Skin: Intact, warm, dry. No rashes or petechiae noted in exposed areas.  ?Neurologic: Alert, oriented to [place], place and time. Fluent speech, no facial droop, no cognitive deficit ?Psychiatric:  Normal affect. Mood is appropriate.  ? ? ?EKG:  The EKG was personally reviewed and demonstrates:  Sinus rhythm , no acute changes  ? ?Telemetry:  Telemetry was personally reviewed and demonstrates:  Sinus bradycardia /rhythm 50-80s ? ?Relevant CV Studies: ? ?CT coronary study from 01/09/2022: ? ?1. Normal coronary calcium score of 0. Patient is low risk for ?coronary events. ?  ?2. Normal coronary origin with right dominance. ?  ?3. No evidence of CAD. ?  ?4. CAD-RADS 0. Consider non-atherosclerotic causes of chest pain. ? ?Laboratory Data: ? ?High Sensitivity Troponin:   ?Recent Labs  ?Lab 03/17/22 ?1809 03/17/22 ?1926 03/17/22 ?2221  ?TROPONINIHS 155* 153* 127*  ?   ?Chemistry ?Recent Labs  ?Lab 03/17/22 ?1809 03/17/22 ?2221  ?NA 137  --   ?K 3.3*  --   ?CL 105  --   ?  CO2 25  --   ?GLUCOSE 101*  --   ?BUN 19  --   ?CREATININE 1.06 0.95  ?CALCIUM 9.3  --   ?GFRNONAA >60 >60  ?ANIONGAP 7  --   ?  ?No results for input(s): PROT, ALBUMIN, AST, ALT, ALKPHOS, BILITOT in the last 168 hours. ?Lipids  ?Recent Labs  ?Lab 03/18/22 ?0441  ?CHOL 159  ?TRIG 102  ?HDL 33*  ?LDLCALC 106*  ?CHOLHDL 4.8  ?  ?Hematology ?Recent Labs  ?Lab 03/17/22 ?1809 03/17/22 ?2221  ?WBC 6.3 6.4  ?RBC 5.08 5.04  ?HGB 14.8 14.3  ?HCT 43.5 42.7  ?MCV 85.6 84.7  ?MCH 29.1 28.4  ?MCHC 34.0 33.5  ?RDW 13.5 13.4  ?PLT 269 270  ? ?Thyroid  ?Recent Labs  ?Lab 03/18/22 ?0441  ?TSH 2.236  ?  ?BNPNo results for input(s): BNP, PROBNP in the last 168 hours.  ?DDimer  ?Recent Labs  ?Lab 03/17/22 ?2221  ?DDIMER 0.83*  ? ? ? ?Radiology/Studies:  ?DG Chest 2 View ? ?Result Date: 03/17/2022 ?CLINICAL DATA:  Chest pain EXAM: CHEST - 2 VIEW COMPARISON:  None Available. FINDINGS: The heart size and mediastinal contours are within normal limits. Both lungs are clear. The visualized skeletal structures are unremarkable. IMPRESSION: No active cardiopulmonary disease. Electronically Signed   By: Fidela Salisbury M.D.   On: 03/17/2022 17:48   ? ? ?Assessment and Plan:   ? ?Atypical chest pain ?- Hs trop flat 150s x3 ?- EKG no acute ischemic changes ?- CXR no acute findings ?- CPR and ESR grossly unremarkable  ?- CT coronary study 01/2022 no CAD ?- will follow Echo, requested by

## 2022-03-18 NOTE — Progress Notes (Signed)
?  Transition of Care (TOC) Screening Note ? ? ?Patient Details  ?Name: Warren Saunders ?Date of Birth: [birth year] ? ? ?Transition of Care [facility]) CM/SW Contact:    ?Lanier Clam, RN ?Phone Number: ?03/18/2022, 3:31 PM ? ? ? ?Transition of Care Department [facility]) has reviewed patient and no TOC needs have been identified at this time. We will continue to monitor patient advancement through interdisciplinary progression rounds. If new patient transition needs arise, please place a TOC consult. ?  ?

## 2022-03-30 NOTE — Progress Notes (Signed)
Cardiology Office Note    Date:  03/31/2022   ID:  Warren Saunders, DOB 1996/09/11, MRN [id number]  PCP:  System, Provider Not In  Cardiologist:  Kate Sable, MD  Electrophysiologist:  None   Chief Complaint: Hospital follow up  History of Present Illness:   Warren Saunders is a 26 y.o. male with history of chest pain, seizure versus syncope, HTN, anxiety with panic attack, and obesity who presents for hospital follow-up as outlined below.  He was seen in the ED twice in 12/2021, initially with chest pain and dyspnea that woke him from sleep.  High-sensitivity troponin negative during that evaluation.  Chest x-ray without acute cardiopulmonary process.  He was seen again in the ED on 12/23/2021 for symptoms concerning for seizure.  CT head showed no acute intracranial abnormality.  CTA chest showed no evidence of PE or other significant acute intrathoracic findings.  ED note documents "upon being told that patient was going to be discharged, he began having another episode of shaking, clutching his chest, and not speaking."  Family members indicated to the treatment team at that time the patient had been seen at [facility] long ED for anxiety and panic attack with similar presentation.  He followed up with Dr. Garen Lah in 12/2021 with Zio patch being recommended and remains pending at this time.  Coronary CTA on 01/09/2022 showed a calcium score of 0 with no evidence of CAD.  No acute extracardiac findings noted.  He was admitted to the hospital from 5/8 through 03/18/2022 with chest pain and associated shortness of breath.  Initial and peak high-sensitivity troponin of 155, subsequently downtrending.  D-dimer mildly elevated at 0.89.  Potassium 3.3.  Chest x-ray no acute cardiopulmonary findings.  EKG showed sinus rhythm with no acute changes.  CTA chest showed no evidence of PE with no acute cardiopulmonary abnormalities noted.  Sed rate and CRP mildly elevated.  Echo demonstrated an EF of 50 to  55%, no regional wall motion abnormalities, normal LV diastolic function parameters, normal RV systolic function and ventricular cavity size, no significant valvular abnormalities, and a normal right atrial pressure of 3 mmHg.  No further cardiac testing was recommended at that time.  He comes in accompanied by his girlfriend today and is doing reasonably well from a cardiac perspective.  He has not had any further episodes of chest discomfort.  He reports this was the first episode of chest discomfort since he was seen in the ED back in February.  He reports these episodes have been randomly occurring and will typically last for 1 hour with spontaneous resolution without intervention.  He denies any dizziness, presyncope, or syncope.  No further seizure-like activity.  No significant lower extremity swelling.  He does smoke marijuana, though denies any other recreational drug use.  He does not have concerns of tainted marijuana contributing to his presentation.  He rarely drinks alcohol.  No significant family history of premature CAD or sudden cardiac death.  He does indicate he wore the Zio patch for 1 day before it fell off.  He does have the monitor at home.  Currently, he is asymptomatic.  Labs independently reviewed: 03/2022 - TSH normal, TC 159, TG 102, HDL 33, LDL 106, Hgb 14.3, PLT 270 potassium 3.3, BUN 19, serum creatinine 1.06 12/2021 - albumin 3.9, AST/ALT normal  Past Medical History:  Diagnosis Date   Anxiety    Hypertension    Seizure [facility])     Past Surgical History:  Procedure Laterality Date  ARTHROSCOPIC REPAIR ACL      Current Medications: Current Meds  Medication Sig   hydrOXYzine (ATARAX) 25 MG tablet Take 1 tablet (25 mg total) by mouth every 6 (six) hours as needed for anxiety.   LORazepam (ATIVAN) 1 MG tablet Take 1 mg by mouth daily as needed for anxiety.    Allergies:   Patient has no known allergies.   Social History   Socioeconomic History   Marital  status: Single    Spouse name: Not on file   Number of children: Not on file   Years of education: Not on file   Highest education level: Not on file  Occupational History   Not on file  Tobacco Use   Smoking status: Never   Smokeless tobacco: Never  Substance and Sexual Activity   Alcohol use: Yes   Drug use: Yes    Types: Marijuana   Sexual activity: Yes  Other Topics Concern   Not on file  Social History Narrative   Not on file   Social Determinants of Health   Financial Resource Strain: Not on file  Food Insecurity: Not on file  Transportation Needs: Not on file  Physical Activity: Not on file  Stress: Not on file  Social Connections: Not on file     Family History:  The patient's family history is not on file.  ROS:   12-point review of systems is negative unless otherwise noted in the HPI.   EKGs/Labs/Other Studies Reviewed:    Studies reviewed were summarized above. The additional studies were reviewed today:  2D echo 03/18/2022: 1. Left ventricular ejection fraction, by estimation, is 50 to 55%. The  left ventricle has low normal function. The left ventricle has no regional  wall motion abnormalities. Left ventricular diastolic parameters were  normal.   2. Right ventricular systolic function is normal. The right ventricular  size is normal. Tricuspid regurgitation signal is inadequate for assessing  PA pressure.   3. The mitral valve is grossly normal. No evidence of mitral valve  regurgitation. No evidence of mitral stenosis.   4. The aortic valve is tricuspid. Aortic valve regurgitation is not  visualized. No aortic stenosis is present.   5. The inferior vena cava is normal in size with greater than 50%  respiratory variability, suggesting right atrial pressure of 3 mmHg.   Conclusion(s)/Recommendation(s): Normal biventricular function without  evidence of hemodynamically significant valvular heart disease. __________  Coronary CTA  01/09/2022: FINDINGS: Aorta:  Normal size.  No calcifications.  No dissection.   Aortic Valve:  Trileaflet.  No calcifications.   Coronary Arteries:  Normal coronary origin.  Right dominance.   RCA is a non dominant artery.  There is no plaque.   Left main is a large artery that gives rise to LAD and LCX arteries.   LAD has no plaque.   LCX is a non-dominant artery that gives rise to an OM1 branch before branching into the PDA and PL branch. There is no plaque.   Other findings:   Normal pulmonary vein drainage into the left atrium.   Normal left atrial appendage without a thrombus.   Normal size of the pulmonary artery.   IMPRESSION: 1. Normal coronary calcium score of 0. Patient is low risk for coronary events. 2. Normal coronary origin with right dominance. 3. No evidence of CAD. 4. CAD-RADS 0. Consider non-atherosclerotic causes of chest pain. __________  Elwyn Reach patch 12/2021: Pending  EKG:  EKG is not ordered today.  Recent Labs:  12/23/2021: ALT 23 03/17/2022: BUN 19; Creatinine, Ser 0.95; Hemoglobin 14.3; Platelets 270; Potassium 3.3; Sodium 137 03/18/2022: TSH 2.236  Recent Lipid Panel    Component Value Date/Time   CHOL 159 03/18/2022 0441   TRIG 102 03/18/2022 0441   HDL 33 (L) 03/18/2022 0441   CHOLHDL 4.8 03/18/2022 0441   VLDL 20 03/18/2022 0441   LDLCALC 106 (H) 03/18/2022 0441    PHYSICAL EXAM:    VS:  BP 120/80 (BP Location: Left Arm, Patient Position: Sitting, Cuff Size: Large)   Pulse 63   Ht 6\' 3"  (1.905 m)   Wt (!) 311 lb (141.1 kg)   SpO2 99%   BMI 38.87 kg/m   BMI: Body mass index is 38.87 kg/m.  Physical Exam Vitals reviewed.  Constitutional:      Appearance: He is well-developed.  HENT:     Head: Normocephalic and atraumatic.  Eyes:     General:        Right eye: No discharge.        Left eye: No discharge.  Cardiovascular:     Rate and Rhythm: Normal rate and regular rhythm.     Pulses:          Posterior tibial pulses are 2+  on the right side and 2+ on the left side.     Heart sounds: Normal heart sounds, S1 normal and S2 normal. Heart sounds not distant. No midsystolic click and no opening snap. No murmur heard.   No friction rub.  Pulmonary:     Effort: Pulmonary effort is normal. No respiratory distress.     Breath sounds: Normal breath sounds. No decreased breath sounds, wheezing or rales.  Chest:     Chest wall: No tenderness.  Abdominal:     General: There is no distension.     Palpations: Abdomen is soft.  Musculoskeletal:     Cervical back: Normal range of motion.     Right lower leg: No edema.     Left lower leg: No edema.  Skin:    General: Skin is warm and dry.     Nails: There is no clubbing.  Neurological:     Mental Status: He is alert and oriented to Warren Saunders, place, and time.  Psychiatric:        Speech: Speech normal.        Behavior: Behavior normal.        Thought Content: Thought content normal.        Judgment: Judgment normal.    Wt Readings from Last 3 Encounters:  03/31/22 (!) 311 lb (141.1 kg)  03/18/22 (!) 300 lb 4.3 oz (136.2 kg)  12/31/21 289 lb 4 oz (131.2 kg)     ASSESSMENT & PLAN:   Chest pain with elevated high-sensitivity troponin: Currently chest pain-free.  Prior ischemic evaluation reassuring with no coronary artery disease noted on coronary CTA.  Cannot exclude elevated troponin in the context of possible tachypalpitations.  Given prior history of possible syncope, we will repeat a Zio patch.  He was encouraged to send in his prior Zio patch despite this having only been worn for 1 night.  Recent echo demonstrated no significant structural abnormalities.  No indication for further ischemic testing.  HTN: Blood pressure is well controlled in the office today.  HLD: LDL 106.  Heart healthy diet encouraged.  Obesity: Weight loss is encouraged through out of the diet and regular exercise.  This was not discussed in detail today.  Anxiety: Follow-up with  PCP as  directed.  This was not discussed in detail today.  Abnormal chest CT: CTA of the chest in 12/2021 demonstrated an 8 mm central right upper lobe groundglass nodule with recommendation for follow-up imaging in 6 to 12 months.  He was made aware of these results today.  Repeat chest CT in 07/2022 did not make mention of this.   Disposition: F/u with Dr. Garen Lah only in 2 months.   Medication Adjustments/Labs and Tests Ordered: Current medicines are reviewed at length with the patient today.  Concerns regarding medicines are outlined above. Medication changes, Labs and Tests ordered today are summarized above and listed in the Patient Instructions accessible in Encounters.   Signed, Christell Faith, PA-C 03/31/2022 10:59 AM     [facility] [place] [address] [place], [place] [address] [phone]

## 2022-03-31 ENCOUNTER — Encounter: Payer: Self-pay | Admitting: Physician Assistant

## 2022-03-31 ENCOUNTER — Ambulatory Visit (INDEPENDENT_AMBULATORY_CARE_PROVIDER_SITE_OTHER): Payer: BC Managed Care – PPO | Admitting: Physician Assistant

## 2022-03-31 ENCOUNTER — Ambulatory Visit (INDEPENDENT_AMBULATORY_CARE_PROVIDER_SITE_OTHER): Payer: BC Managed Care – PPO

## 2022-03-31 VITALS — BP 120/80 | HR 63 | Ht 75.0 in | Wt 311.0 lb

## 2022-03-31 DIAGNOSIS — R55 Syncope and collapse: Secondary | ICD-10-CM

## 2022-03-31 DIAGNOSIS — R9389 Abnormal findings on diagnostic imaging of other specified body structures: Secondary | ICD-10-CM

## 2022-03-31 DIAGNOSIS — I10 Essential (primary) hypertension: Secondary | ICD-10-CM

## 2022-03-31 DIAGNOSIS — R911 Solitary pulmonary nodule: Secondary | ICD-10-CM

## 2022-03-31 DIAGNOSIS — R072 Precordial pain: Secondary | ICD-10-CM | POA: Diagnosis not present

## 2022-03-31 DIAGNOSIS — R569 Unspecified convulsions: Secondary | ICD-10-CM | POA: Diagnosis not present

## 2022-03-31 NOTE — Patient Instructions (Signed)
Medication Instructions:  No changes at this time.   *If you need a refill on your cardiac medications before your next appointment, please call your pharmacy*   Lab Work: None  If you have labs (blood work) drawn today and your tests are completely normal, you will receive your results only by: MyChart Message (if you have MyChart) OR A paper copy in the mail If you have any lab test that is abnormal or we need to change your treatment, we will call you to review the results.   Testing/Procedures: Your provider has ordered a heart monitor to wear for 14 days. This will be mailed to your home with instructions on placement. Once you have finished the time frame requested, you will return monitor in box provided.     Non-Cardiac CT scanning, (CAT scanning), is a noninvasive, special x-ray that produces cross-sectional images of the body using x-rays and a computer. CT scans help physicians diagnose and treat medical conditions. For some CT exams, a contrast material is used to enhance visibility in the area of the body being studied. CT scans provide greater clarity and reveal more details than regular x-ray exams.  Please call [phone] to have this scheduled in late September.    Follow-Up: At [facility], you and your health needs are our priority.  As part of our continuing mission to provide you with exceptional heart care, we have created designated Provider Care Teams.  These Care Teams include your primary Cardiologist (physician) and Advanced Practice Providers (APPs -  Physician Assistants and Nurse Practitioners) who all work together to provide you with the care you need, when you need it.    Your next appointment:   2 month(s)  The format for your next appointment:   In [place]  Provider:   Debbe Odea, MD ONLY      Important Information About Sugar

## 2022-04-04 DIAGNOSIS — R55 Syncope and collapse: Secondary | ICD-10-CM

## 2022-05-02 ENCOUNTER — Telehealth: Payer: Self-pay | Admitting: *Deleted

## 2022-05-07 NOTE — Telephone Encounter (Signed)
Left voicemail message to call back for review of results.  

## 2022-05-20 NOTE — Telephone Encounter (Signed)
Left voicemail message to call back for review of monitor results.  

## 2022-05-26 ENCOUNTER — Encounter: Payer: Self-pay | Admitting: *Deleted

## 2022-05-26 NOTE — Telephone Encounter (Signed)
Letter mailed to patient with her results and instructions to please call if any questions.

## 2022-06-09 ENCOUNTER — Ambulatory Visit: Payer: BC Managed Care – PPO | Admitting: Cardiology

## 2022-06-10 ENCOUNTER — Encounter: Payer: Self-pay | Admitting: Cardiology

## 2023-06-16 IMAGING — CT CT ANGIO CHEST
2 of 7 series · 17 of 46 positions shown · IV contrast (APPLIED)
Comparison: 12/19/2021

CLINICAL DATA: Seizure activity at work, concern for pulmonary
embolus

EXAM:
CT ANGIOGRAPHY CHEST WITH CONTRAST
TECHNIQUE: Multidetector CT imaging of the chest was performed using the
standard protocol during bolus administration of intravenous
contrast. Multiplanar CT image reconstructions and MIPs were
obtained to evaluate the vascular anatomy.

[Series 6: thins · axial · 0.81mm/px · z∈[-744,-479]mm · 14 of 369 slices shown]
[im 19/369  lung]
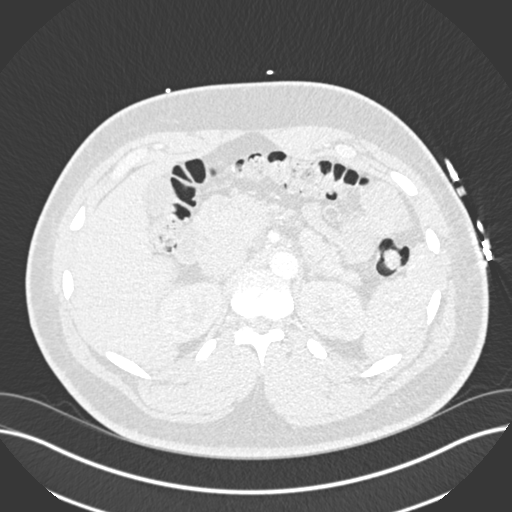
[im 56/369  soft-tissue]
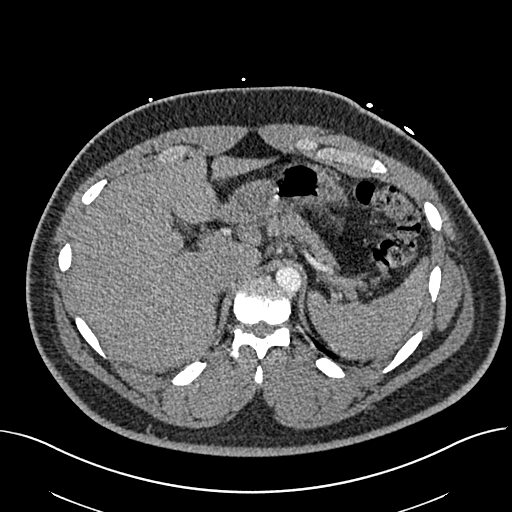
[im 74/369  lung]
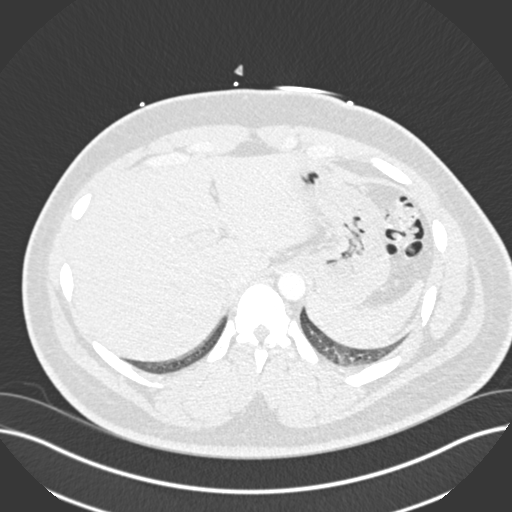
[im 93/369  soft-tissue]
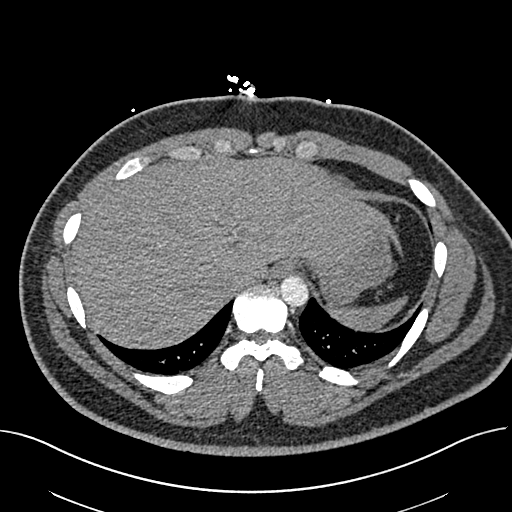
[im 129/369  lung]
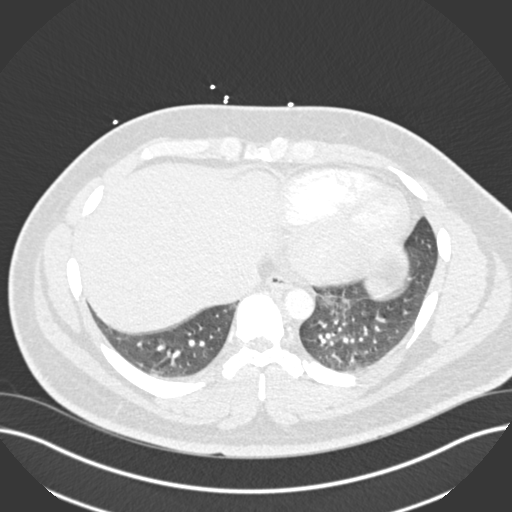
[im 148/369  soft-tissue]
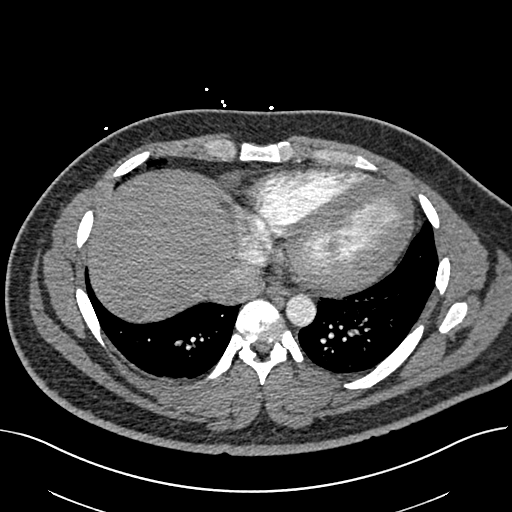
[im 166/369  lung]
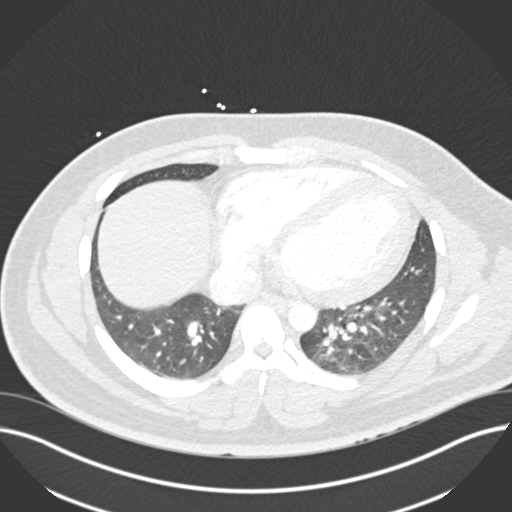
[im 203/369  soft-tissue]
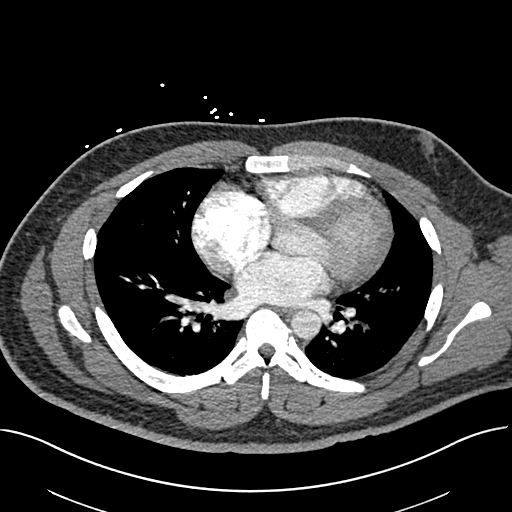
[im 221/369  lung]
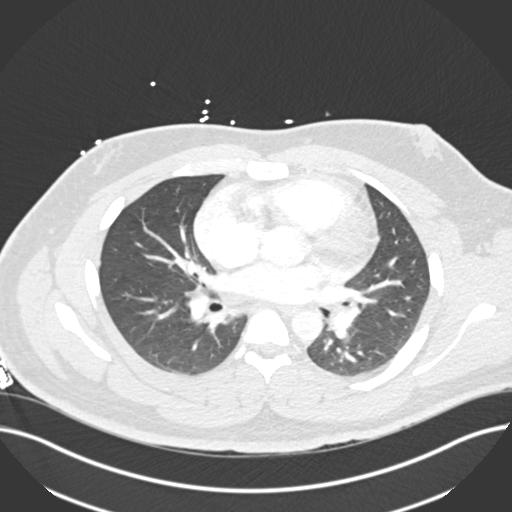
[im 240/369  soft-tissue]
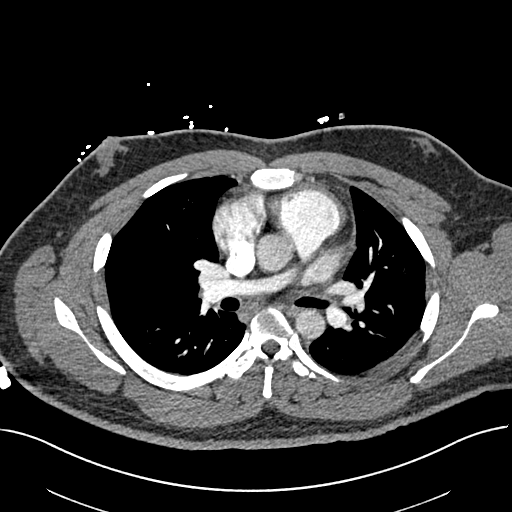
[im 277/369  lung]
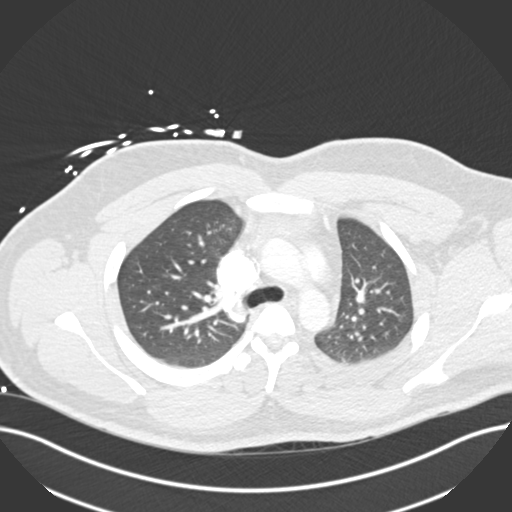
[im 295/369  soft-tissue]
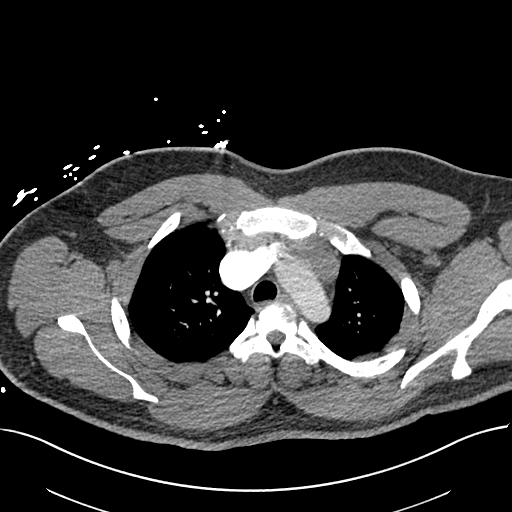
[im 313/369  lung]
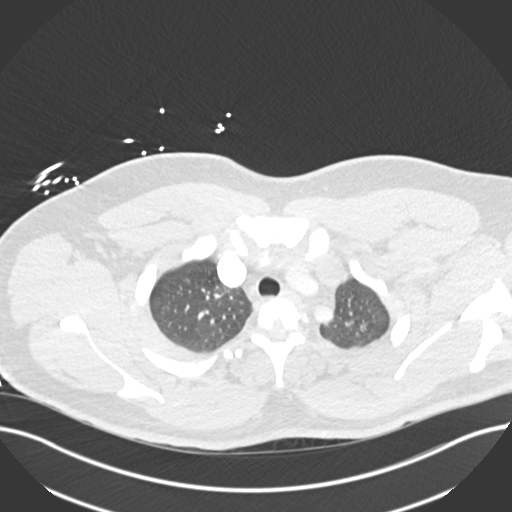
[im 350/369  soft-tissue]
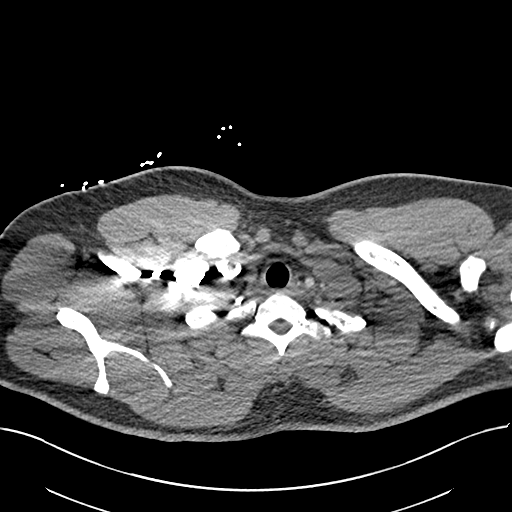

[Series 8: coronal mpr · coronal · 0.59mm/px · 3 of 106 slices shown]
[im 27/106  soft-tissue]
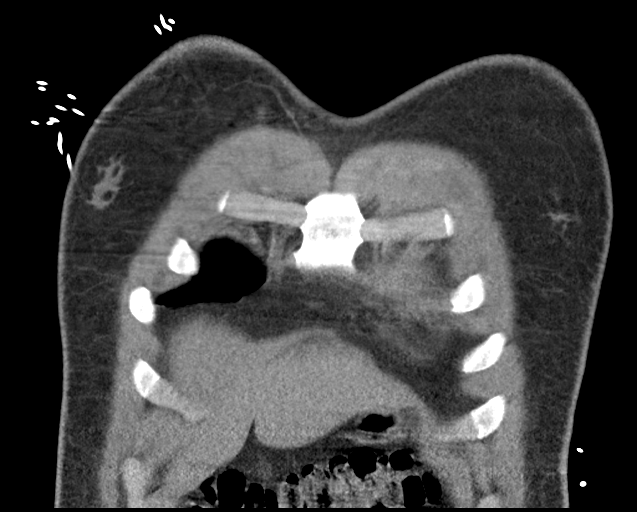
[im 53/106  soft-tissue]
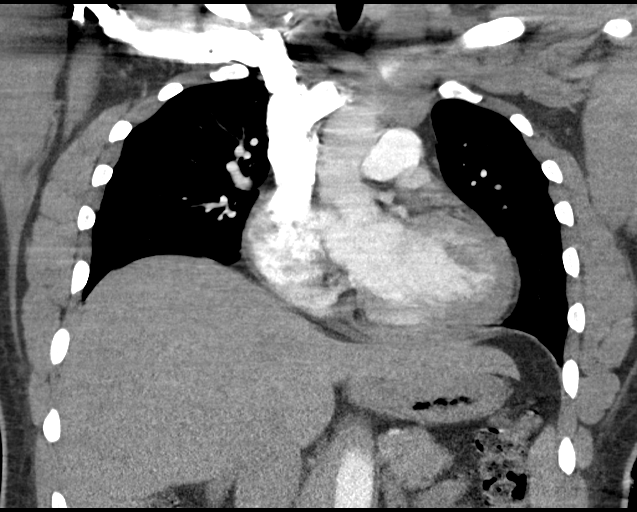
[im 79/106  soft-tissue]
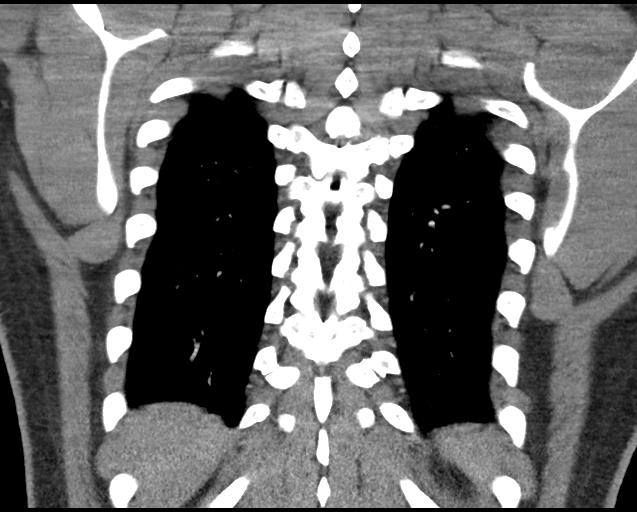

[17 of 46 positions shown; findings below may reference images not displayed]

RADIATION DOSE REDUCTION: This exam was performed according to the
departmental dose-optimization program which includes automated
exposure control, adjustment of the mA and/or kV according to
patient size and/or use of iterative reconstruction technique.

CONTRAST:  75mL OMNIPAQUE IOHEXOL 350 MG/ML SOLN
FINDINGS: Cardiovascular: Intact thoracic aorta. Patent 2 vessel arch anatomy.
Negative for aneurysm or dissection. No acute aortic process.

Central pulmonary arteries are normal in caliber and appear patent.
No significant central or proximal hilar large pulmonary embolus.
Limited assessment of the lower lobe segmental branches because
limited contrast opacification and respiratory motion artifact.

Normal heart size.  No pericardial effusion.

Mediastinum/Nodes: No enlarged mediastinal, hilar, or axillary lymph
nodes. Thyroid gland, trachea, and esophagus demonstrate no
significant findings.

Lungs/Pleura: Minimal bibasilar atelectasis.

Posterior right upper lobe central ground-glass nodule measures 8
mm, image 33/7 remains nonspecific.

No other acute airspace process, collapse or consolidation. Negative
for significant pneumonia. No interstitial disease or edema. No
pleural abnormality, effusion, or pneumothorax. Trachea and central
airways are patent.

Upper Abdomen: No acute upper abdominal finding.

Musculoskeletal: Symmetric gynecomastia.  No acute osseous finding.

Review of the MIP images confirms the above findings.
IMPRESSION: Negative for significant acute pulmonary embolus by CTA.

No other significant acute intrathoracic finding.

Minor basilar atelectasis.

8 mm central right upper lobe ground-glass nodule. Initial follow-up
with CT at 6-12 months is recommended to confirm persistence. If
persistent, repeat CT is recommended every 2 years until 5 years of
stability has been established. This recommendation follows the
consensus statement: Guidelines for Management of Incidental
Pulmonary Nodules Detected on CT Images: From the [HOSPITAL]

## 2023-06-16 IMAGING — CT CT HEAD W/O CM
4 series · 16 of 47 positions shown, 18 images · non-contrast
Comparison: None.

CLINICAL DATA: Seizure, new-onset, no history of trauma



[Series 2: head bone · axial · 0.44mm/px · z∈[-135,-103]mm · 3 of 79 slices shown]
[im 8/79  bone]
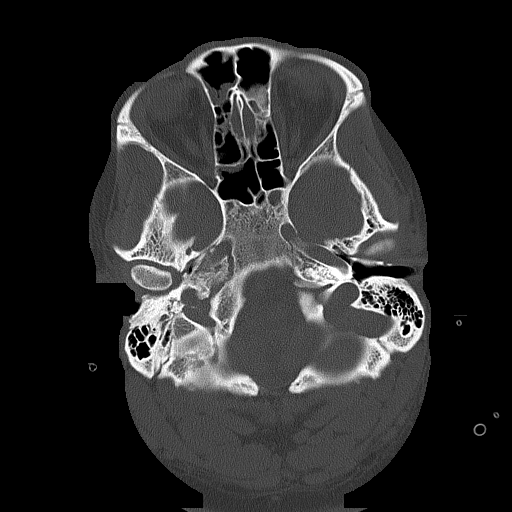
[im 16/79  bone]
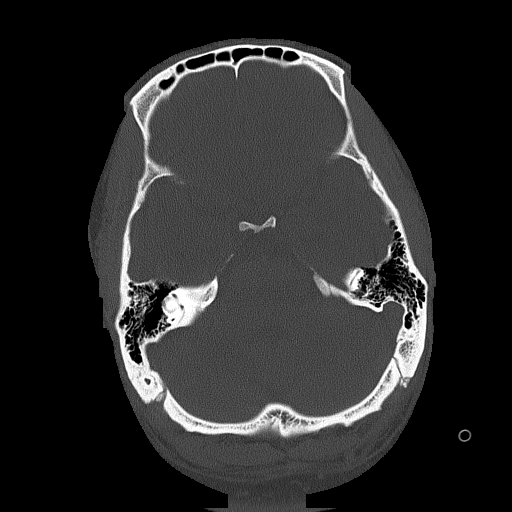
[im 24/79  bone]
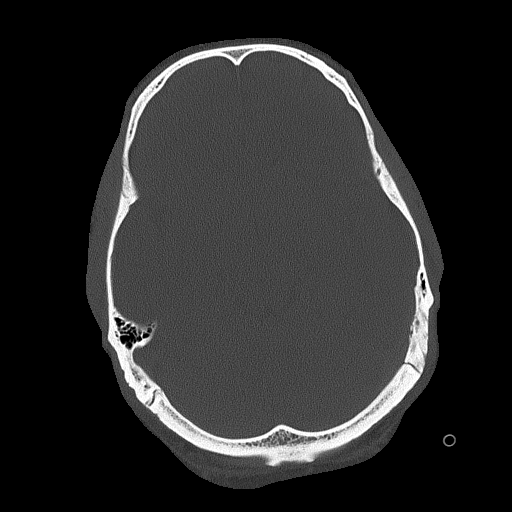

[Series 3: head wo · axial · 0.44mm/px · z∈[-134,-14]mm · 7 of 32 slices shown, 9 images]
[im 4/32  brain]
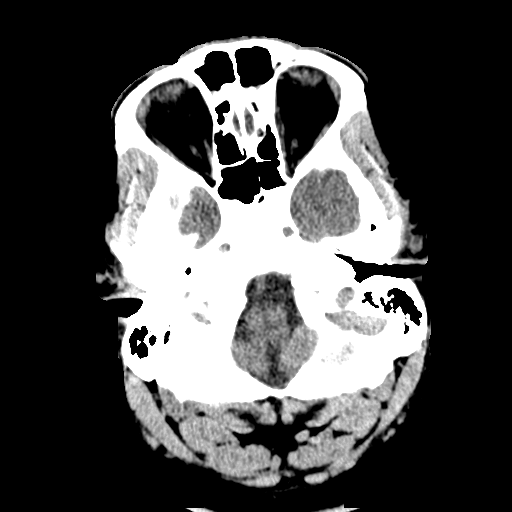
[im 4/32  bone]
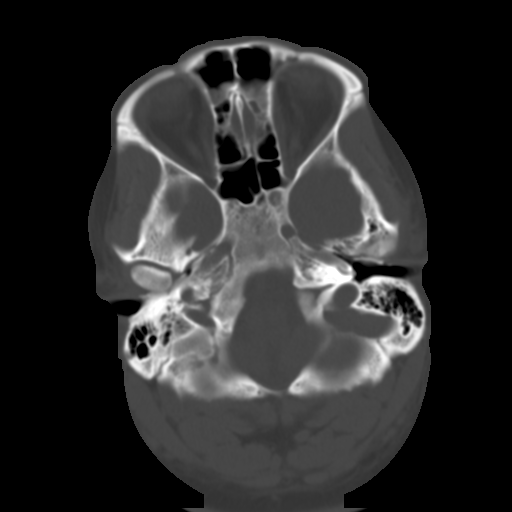
[im 8/32  brain]
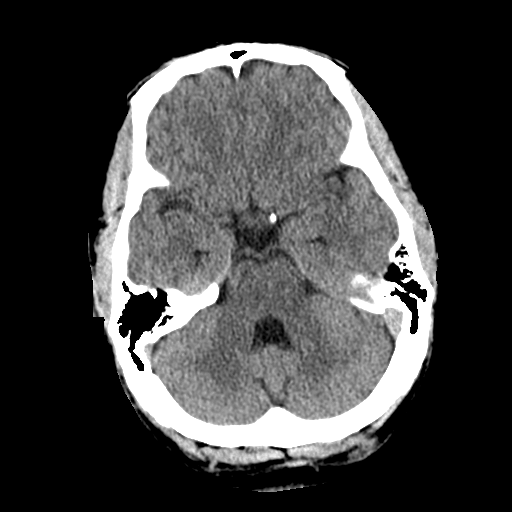
[im 12/32  brain]
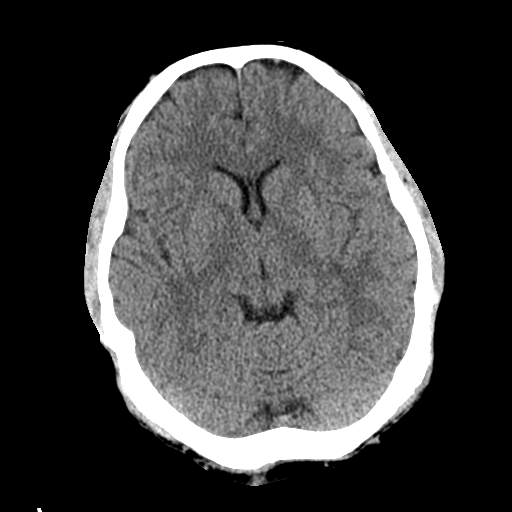
[im 16/32  brain]
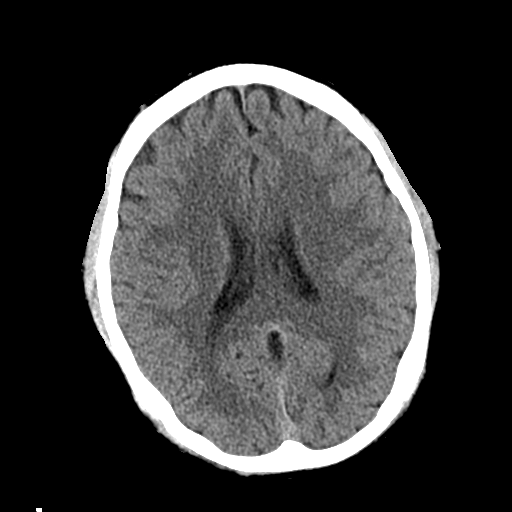
[im 20/32  brain]
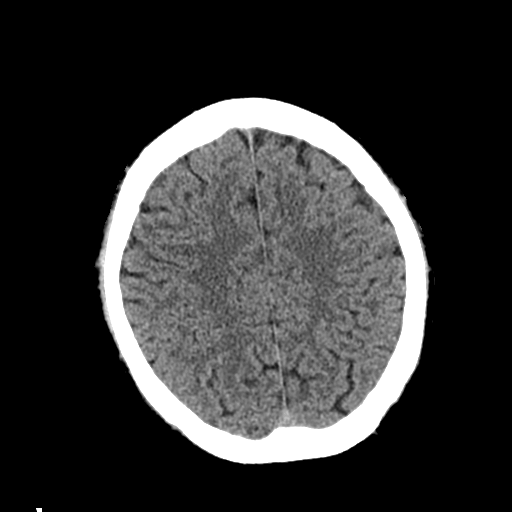
[im 20/32  bone]
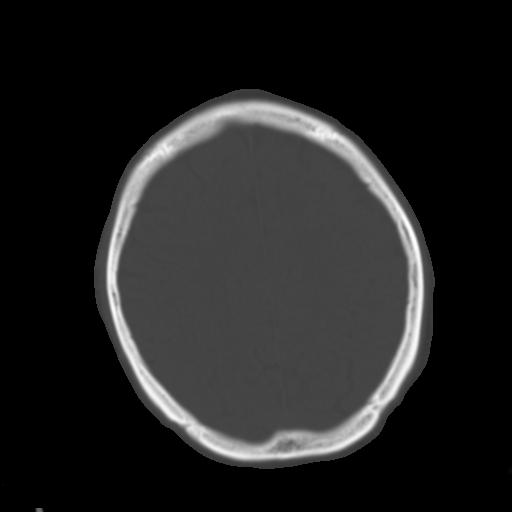
[im 24/32  brain]
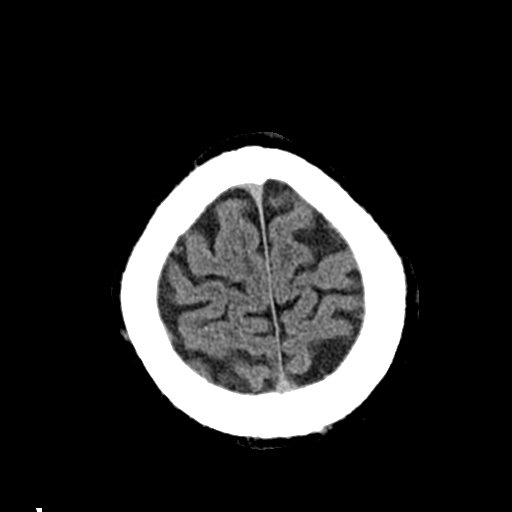
[im 28/32  brain]
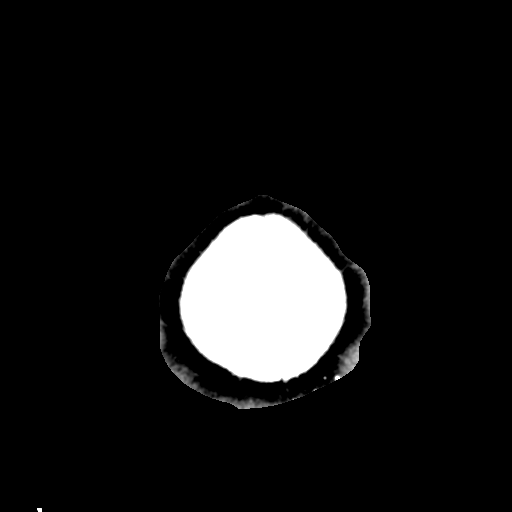

[Series 4: coronal soft tissue · coronal · 0.35mm/px · 3 of 70 slices shown]
[im 24/70  brain]
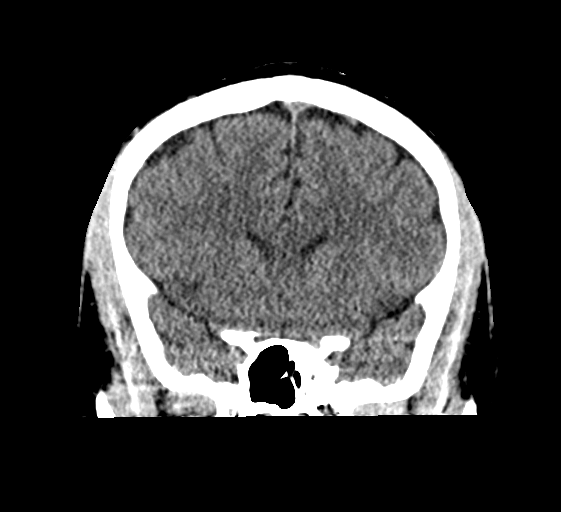
[im 31/70  brain]
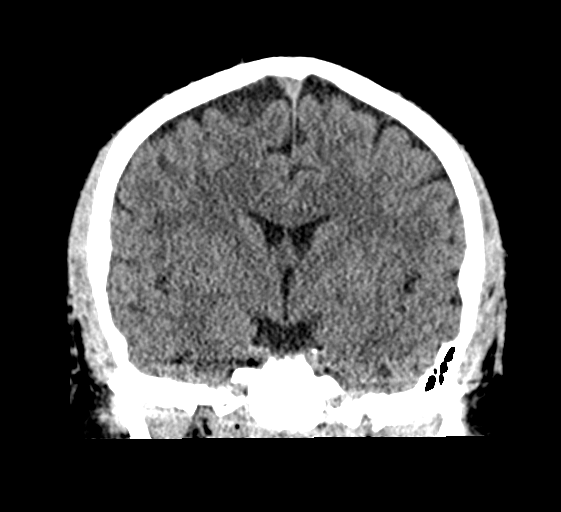
[im 39/70  brain]
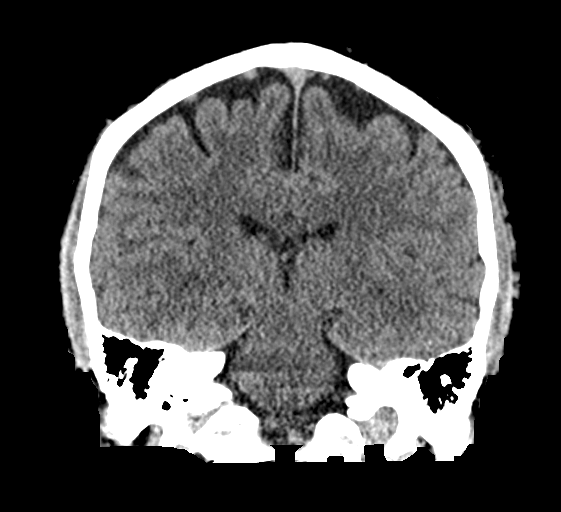

[Series 5: sagittal soft tissue · sagittal · 0.35mm/px · 3 of 60 slices shown]
[im 20/60  brain]
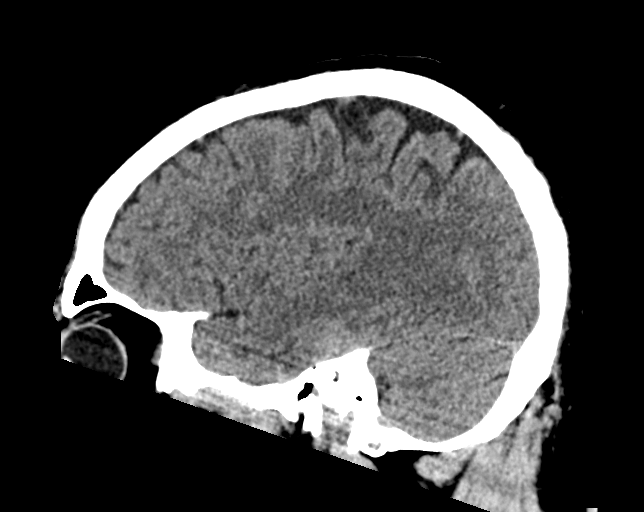
[im 30/60  brain]
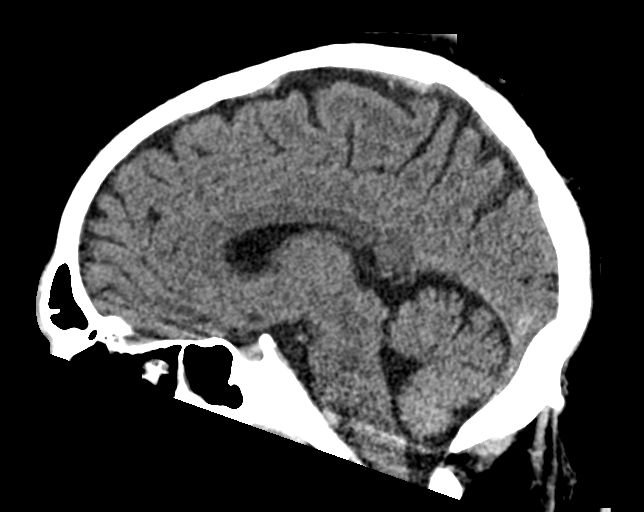
[im 40/60  brain]
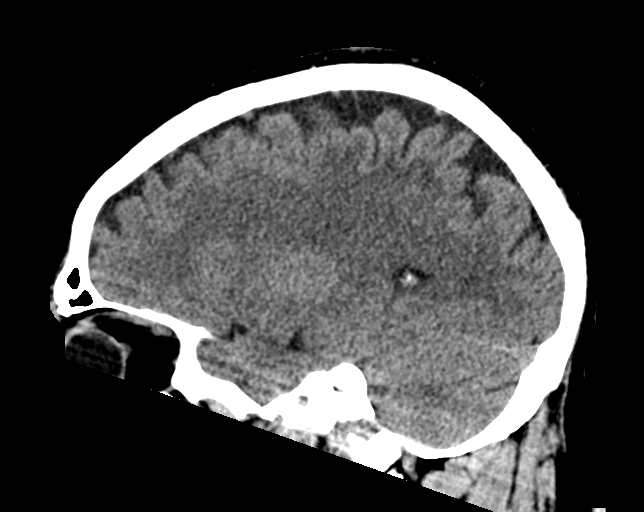

[16 of 47 positions shown; findings below may reference images not displayed]

FINDINGS: Brain: No evidence of acute intracranial hemorrhage or extra-axial
collection.No evidence of mass lesion/concern mass effect.The
ventricles are normal in size.

Vascular: No hyperdense vessel or unexpected calcification.

Skull: Normal. Negative for fracture or focal lesion.

Sinuses/Orbits: Mucosal thickening of the ethmoid air cells,
sphenoid sinuses, and frontal sinuses.

Other: None.
IMPRESSION: No acute intracranial abnormality.
